# Patient Record
Sex: Male | Born: 1960 | Race: White | Hispanic: No | Smoking: Current every day smoker
Health system: Southern US, Community
[De-identification: ages and names within clinical notes are randomized; demographics above are authoritative.]

## PROBLEM LIST (undated history)

## (undated) DIAGNOSIS — R9439 Abnormal result of other cardiovascular function study: Secondary | ICD-10-CM

## (undated) DIAGNOSIS — IMO0002 Reserved for concepts with insufficient information to code with codable children: Secondary | ICD-10-CM

## (undated) DIAGNOSIS — I10 Essential (primary) hypertension: Secondary | ICD-10-CM

## (undated) DIAGNOSIS — Z720 Tobacco use: Secondary | ICD-10-CM

## (undated) HISTORY — DX: Reserved for concepts with insufficient information to code with codable children: IMO0002

## (undated) HISTORY — DX: Essential (primary) hypertension: I10

## (undated) HISTORY — DX: Abnormal result of other cardiovascular function study: R94.39

## (undated) HISTORY — DX: Tobacco use: Z72.0

## (undated) HISTORY — PX: LUMBAR FUSION: SHX111

---

## 2005-03-06 ENCOUNTER — Encounter (INDEPENDENT_AMBULATORY_CARE_PROVIDER_SITE_OTHER): Payer: Self-pay | Admitting: *Deleted

## 2005-03-06 ENCOUNTER — Ambulatory Visit (HOSPITAL_COMMUNITY): Admission: RE | Admit: 2005-03-06 | Discharge: 2005-03-06 | Payer: Self-pay | Admitting: Gastroenterology

## 2009-02-03 ENCOUNTER — Emergency Department (HOSPITAL_COMMUNITY): Admission: EM | Admit: 2009-02-03 | Discharge: 2009-02-03 | Payer: Self-pay | Admitting: Emergency Medicine

## 2009-02-06 ENCOUNTER — Emergency Department (HOSPITAL_COMMUNITY): Admission: EM | Admit: 2009-02-06 | Discharge: 2009-02-06 | Payer: Self-pay | Admitting: Emergency Medicine

## 2009-02-09 ENCOUNTER — Encounter: Admission: RE | Admit: 2009-02-09 | Discharge: 2009-02-09 | Payer: Self-pay | Admitting: Orthopedic Surgery

## 2009-04-20 ENCOUNTER — Inpatient Hospital Stay (HOSPITAL_COMMUNITY): Admission: RE | Admit: 2009-04-20 | Discharge: 2009-04-24 | Payer: Self-pay | Admitting: Neurosurgery

## 2009-10-23 ENCOUNTER — Ambulatory Visit: Payer: Self-pay | Admitting: Cardiovascular Disease

## 2010-05-16 LAB — CBC
HCT: 46.6 % (ref 39.0–52.0)
Hemoglobin: 16.3 g/dL (ref 13.0–17.0)
MCV: 98.1 fL (ref 78.0–100.0)
RDW: 13.4 % (ref 11.5–15.5)

## 2010-05-16 LAB — BASIC METABOLIC PANEL
BUN: 8 mg/dL (ref 6–23)
Calcium: 8.6 mg/dL (ref 8.4–10.5)
Glucose, Bld: 121 mg/dL — ABNORMAL HIGH (ref 70–99)
Sodium: 133 mEq/L — ABNORMAL LOW (ref 135–145)

## 2010-05-16 LAB — ABO/RH: ABO/RH(D): AB POS

## 2010-05-16 LAB — SURGICAL PCR SCREEN

## 2010-10-15 ENCOUNTER — Encounter: Payer: Self-pay | Admitting: Cardiovascular Disease

## 2010-10-25 ENCOUNTER — Other Ambulatory Visit: Payer: Self-pay | Admitting: *Deleted

## 2010-10-25 DIAGNOSIS — I10 Essential (primary) hypertension: Secondary | ICD-10-CM

## 2010-10-31 ENCOUNTER — Other Ambulatory Visit: Payer: Self-pay | Admitting: *Deleted

## 2010-10-31 ENCOUNTER — Ambulatory Visit: Payer: Self-pay | Admitting: Cardiovascular Disease

## 2010-11-22 ENCOUNTER — Encounter: Payer: Self-pay | Admitting: Cardiovascular Disease

## 2012-06-10 ENCOUNTER — Encounter: Payer: Self-pay | Admitting: *Deleted

## 2016-02-20 ENCOUNTER — Emergency Department (HOSPITAL_COMMUNITY)
Admission: EM | Admit: 2016-02-20 | Discharge: 2016-02-20 | Disposition: A | Discharge status: Home / Self Care | Payer: Self-pay | Attending: Emergency Medicine | Admitting: Emergency Medicine

## 2016-02-20 ENCOUNTER — Emergency Department (HOSPITAL_COMMUNITY): Payer: Self-pay

## 2016-02-20 ENCOUNTER — Encounter (HOSPITAL_COMMUNITY): Payer: Self-pay

## 2016-02-20 DIAGNOSIS — M7918 Myalgia, other site: Secondary | ICD-10-CM

## 2016-02-20 DIAGNOSIS — I10 Essential (primary) hypertension: Secondary | ICD-10-CM | POA: Insufficient documentation

## 2016-02-20 DIAGNOSIS — Z79899 Other long term (current) drug therapy: Secondary | ICD-10-CM | POA: Insufficient documentation

## 2016-02-20 DIAGNOSIS — Z7982 Long term (current) use of aspirin: Secondary | ICD-10-CM | POA: Insufficient documentation

## 2016-02-20 DIAGNOSIS — F17200 Nicotine dependence, unspecified, uncomplicated: Secondary | ICD-10-CM | POA: Insufficient documentation

## 2016-02-20 DIAGNOSIS — M791 Myalgia: Secondary | ICD-10-CM | POA: Insufficient documentation

## 2016-02-20 LAB — I-STAT TROPONIN, ED: Troponin i, poc: 0.02 ng/mL (ref 0.00–0.08)

## 2016-02-20 LAB — COMPREHENSIVE METABOLIC PANEL
ALBUMIN: 4.1 g/dL (ref 3.5–5.0)
ALT: 27 U/L (ref 17–63)
AST: 25 U/L (ref 15–41)
Alkaline Phosphatase: 78 U/L (ref 38–126)
Anion gap: 10 (ref 5–15)
BUN: 11 mg/dL (ref 6–20)
CHLORIDE: 100 mmol/L — AB (ref 101–111)
CO2: 24 mmol/L (ref 22–32)
Calcium: 9.6 mg/dL (ref 8.9–10.3)
Creatinine, Ser: 1.09 mg/dL (ref 0.61–1.24)
GFR calc Af Amer: >60 mL/min (ref >60)
GFR calc non Af Amer: >60 mL/min (ref >60)
GLUCOSE: 101 mg/dL — AB (ref 65–99)
POTASSIUM: 4.8 mmol/L (ref 3.5–5.1)
SODIUM: 134 mmol/L — AB (ref 135–145)
Total Bilirubin: 1.1 mg/dL (ref 0.3–1.2)
Total Protein: 7.2 g/dL (ref 6.5–8.1)

## 2016-02-20 LAB — CBC
HEMATOCRIT: 48.9 % (ref 39.0–52.0)
Hemoglobin: 17.5 g/dL — ABNORMAL HIGH (ref 13.0–17.0)
MCH: 34.2 pg — AB (ref 26.0–34.0)
MCHC: 35.8 g/dL (ref 30.0–36.0)
MCV: 95.5 fL (ref 78.0–100.0)
Platelets: 325 10*3/uL (ref 150–400)
RBC: 5.12 MIL/uL (ref 4.22–5.81)
RDW: 13.3 % (ref 11.5–15.5)
WBC: 8.8 10*3/uL (ref 4.0–10.5)

## 2016-02-20 LAB — URINALYSIS, ROUTINE W REFLEX MICROSCOPIC
Bilirubin Urine: NEGATIVE
Glucose, UA: NEGATIVE mg/dL
HGB URINE DIPSTICK: NEGATIVE
Ketones, ur: NEGATIVE mg/dL
Leukocytes, UA: NEGATIVE
Nitrite: NEGATIVE
PH: 5 (ref 5.0–8.0)
Protein, ur: NEGATIVE mg/dL
SPECIFIC GRAVITY, URINE: 1.021 (ref 1.005–1.030)

## 2016-02-20 LAB — LIPASE, BLOOD: LIPASE: 41 U/L (ref 11–51)

## 2016-02-20 MED ORDER — MELOXICAM 7.5 MG PO TABS: 7.5000 mg | ORAL_TABLET | Freq: Every day | ORAL | 0 refills | Status: DC

## 2016-02-20 NOTE — Discharge Instructions (Signed)
Your work up today did not reveal an emergent cause for your symptoms.  It is likely musculoskeletal in nature.  Please start taking Mobic once daily, you may take it with food if it causes GI upset or diarrhea.  Apply heat for 10-20 minutes three times daily.  It is important you follow up with your primary care physician as soon as possible for further outpatient management.  Return to the ED for any new or worsening symptoms.

## 2016-02-20 NOTE — ED Provider Notes (Signed)
[facility]-EMERGENCY DEPT Provider Note   CSN: [id number] Arrival date & time: 02/20/16  [id number]     History   Chief Complaint Chief Complaint  Patient presents with  . Abdominal Pain    HPI Shirlee LatchWallace J Boxley is a 55 y.o. male.  HPI Shirlee LatchWallace J Lukasik is a 55 y.o. male with PMH significant for HTN, tobacco abuse, and DDD who presents with sudden onset, waxing and waning, "intense" RUQ abdominal pain that initially began over the summer.  He states it went away spontaneously and then returned in November and then became worse 3 days ago.  He states he initially thought it was a "pulled muscle".  No associated fever, chills, cough, CP, SOB, abdominal pain, N/V/D, flank pain, or urinary symptoms.  He has been taking Ibuprofen with some relief, but it never completely goes away.  No prior abdominal surgeries.  No hx of PE/DVT.  No unilateral leg swelling. No trauma/injury.  Past Medical History:  Diagnosis Date  . Abnormal stress test    Low risk but abnormal stress test  . DDD (degenerative disc disease)   . Hypertension   . Tobacco abuse     There are no active problems to display for this patient.   History reviewed. No pertinent surgical history.     Home Medications    Prior to Admission medications   Medication Sig Start Date End Date Taking? Authorizing Provider  aspirin 81 MG chewable tablet Chew 81 mg by mouth every evening.   Yes Historical Provider, MD  losartan (COZAAR) 100 MG tablet Take 100 mg by mouth every evening.  02/17/16  Yes Historical Provider, MD  meloxicam (MOBIC) 7.5 MG tablet Take 1 tablet (7.5 mg total) by mouth daily. 02/20/16   Cheri FowlerKayla Tam Delisle, PA-C    Family History No family history on file.  Social History Social History  Substance Use Topics  . Smoking status: Current Every Day Smoker    Packs/day: 0.50  . Smokeless tobacco: Not on file  . Alcohol use Yes     Allergies   Patient has no known allergies.   Review of Systems Review of  Systems All other systems negative unless otherwise stated in HPI   Physical Exam Updated Vital Signs BP 183/81   Pulse 85   Temp 98.1 F (36.7 C) (Oral)   Resp 18   Ht 5\' 10"  (1.778 m)   Wt 77.1 kg   SpO2 97%   BMI 24.39 kg/m   Physical Exam  Constitutional: He is oriented to person, place, and time. He appears well-developed and well-nourished.  Non-toxic appearance. He does not have a sickly appearance. He does not appear ill.  HENT:  Head: Normocephalic and atraumatic.  Mouth/Throat: Oropharynx is clear and moist.  Eyes: Conjunctivae are normal. Pupils are equal, round, and reactive to light.  Neck: Normal range of motion. Neck supple.  Cardiovascular: Normal rate and regular rhythm.   Pulmonary/Chest: Effort normal and breath sounds normal. No accessory muscle usage or stridor. No respiratory distress. He has no wheezes. He has no rhonchi. He has no rales.     He exhibits no tenderness.    Abdominal: Soft. Bowel sounds are normal. He exhibits no distension. There is no tenderness. There is no rebound and no guarding.  Musculoskeletal: Normal range of motion.  Lymphadenopathy:    He has no cervical adenopathy.  Neurological: He is alert and oriented to person, place, and time.  Speech clear without dysarthria.  Skin: Skin is warm and  dry.  Psychiatric: He has a normal mood and affect. His behavior is normal.     ED Treatments / Results  Labs (all labs ordered are listed, but only abnormal results are displayed) Labs Reviewed  COMPREHENSIVE METABOLIC PANEL - Abnormal; Notable for the following:       Result Value   Sodium 134 (*)    Chloride 100 (*)    Glucose, Bld 101 (*)    All other components within normal limits  CBC - Abnormal; Notable for the following:    Hemoglobin 17.5 (*)    MCH 34.2 (*)    All other components within normal limits  LIPASE, BLOOD  URINALYSIS, ROUTINE W REFLEX MICROSCOPIC  I-STAT TROPOININ, ED    EKG  EKG  Interpretation None       Radiology Dg Chest 2 View  Result Date: 02/20/2016 CLINICAL DATA:  Right chest/axillary pain EXAM: CHEST  2 VIEW COMPARISON:  April 20, 2009 FINDINGS: There is bullous disease in the right apex. Lungs elsewhere are clear. Heart size and pulmonary vascularity are normal. No adenopathy. No bone lesions. There are foci of calcification in each carotid artery. IMPRESSION: No edema or consolidation. Bullous disease right apex. Foci of carotid artery calcification bilaterally. Electronically Signed   By: Bretta BangWilliam  Woodruff III M.D.   On: 02/20/2016 11:07    Procedures Procedures (including critical care time)  Medications Ordered in ED Medications - No data to display   Initial Impression / Assessment and Plan / ED Course  I have reviewed the triage vital signs and the nursing notes.  Pertinent labs & imaging results that were available during my care of the patient were reviewed by me and considered in my medical decision making (see chart for details).  Clinical Course    Patient presents with right sided pain, he points to his right lower chest wall and RUQ.  He has no other symptoms.  This began in the summer and has been intermittent.  Feels like pulled muscle.  No trigger.  Vitals stable.  Physical exam as above.  Labs without acute abnormalities.  CXR does show some bullous disease, advised the patient to stop smoking.  Clinically this appears to be musculoskeletal in nature and recommend trying Mobic and heat.  Low suspicion for ACS, PE, gall bladder disease, or other acute cardiopulmonary or GI etiology.  This does not sound like nephrolithiasis.  Patient has PCP and urged follow up for continued outpatient management.  Return precautions discussed.  Stable for discharge.  Case has been discussed with Dr. Effie ShyWentz who agrees with the above plan for discharge.   Final Clinical Impressions(s) / ED Diagnoses   Final diagnoses:  Musculoskeletal pain    New  Prescriptions Discharge Medication List as of 02/20/2016 12:18 PM    START taking these medications   Details  meloxicam (MOBIC) 7.5 MG tablet Take 1 tablet (7.5 mg total) by mouth daily., Starting Wed 02/20/2016, Print         Cheri FowlerKayla Tejon Gracie, PA-C 02/20/16 2013    Mancel BaleElliott Wentz, MD 02/21/16 [phone]

## 2016-02-20 NOTE — ED Notes (Signed)
PT is in stable condition upon d/c and ambulates from ED. 

## 2016-02-20 NOTE — ED Notes (Signed)
Rt upper flank pain x 2-3 weeks,  Got worse on Sunday area was tender to touch, hurts to take a deep breath, no worsening cough denies n/v states stools have been loose but not diarrhea, denies dysuria

## 2016-02-20 NOTE — ED Triage Notes (Signed)
Pt presents for evaluation of R upper side pain starting 1 week ago. Pt. Reports pain occurred once before but resolved on own. Pt. Reports pain worse with deep breathing and movement. Pt. States initially was tender but now is nontender to palpation. Pt AxO x4, ambulatory in triage. Denies N/V/D. Denies CP/SOB

## 2018-07-10 IMAGING — DX DG CHEST 2V
2 series · 2 of 2 positions shown · non-contrast
Comparison: April 20, 2009

CLINICAL DATA: Right chest/axillary pain

EXAM:
CHEST  2 VIEW

[w chest pa]
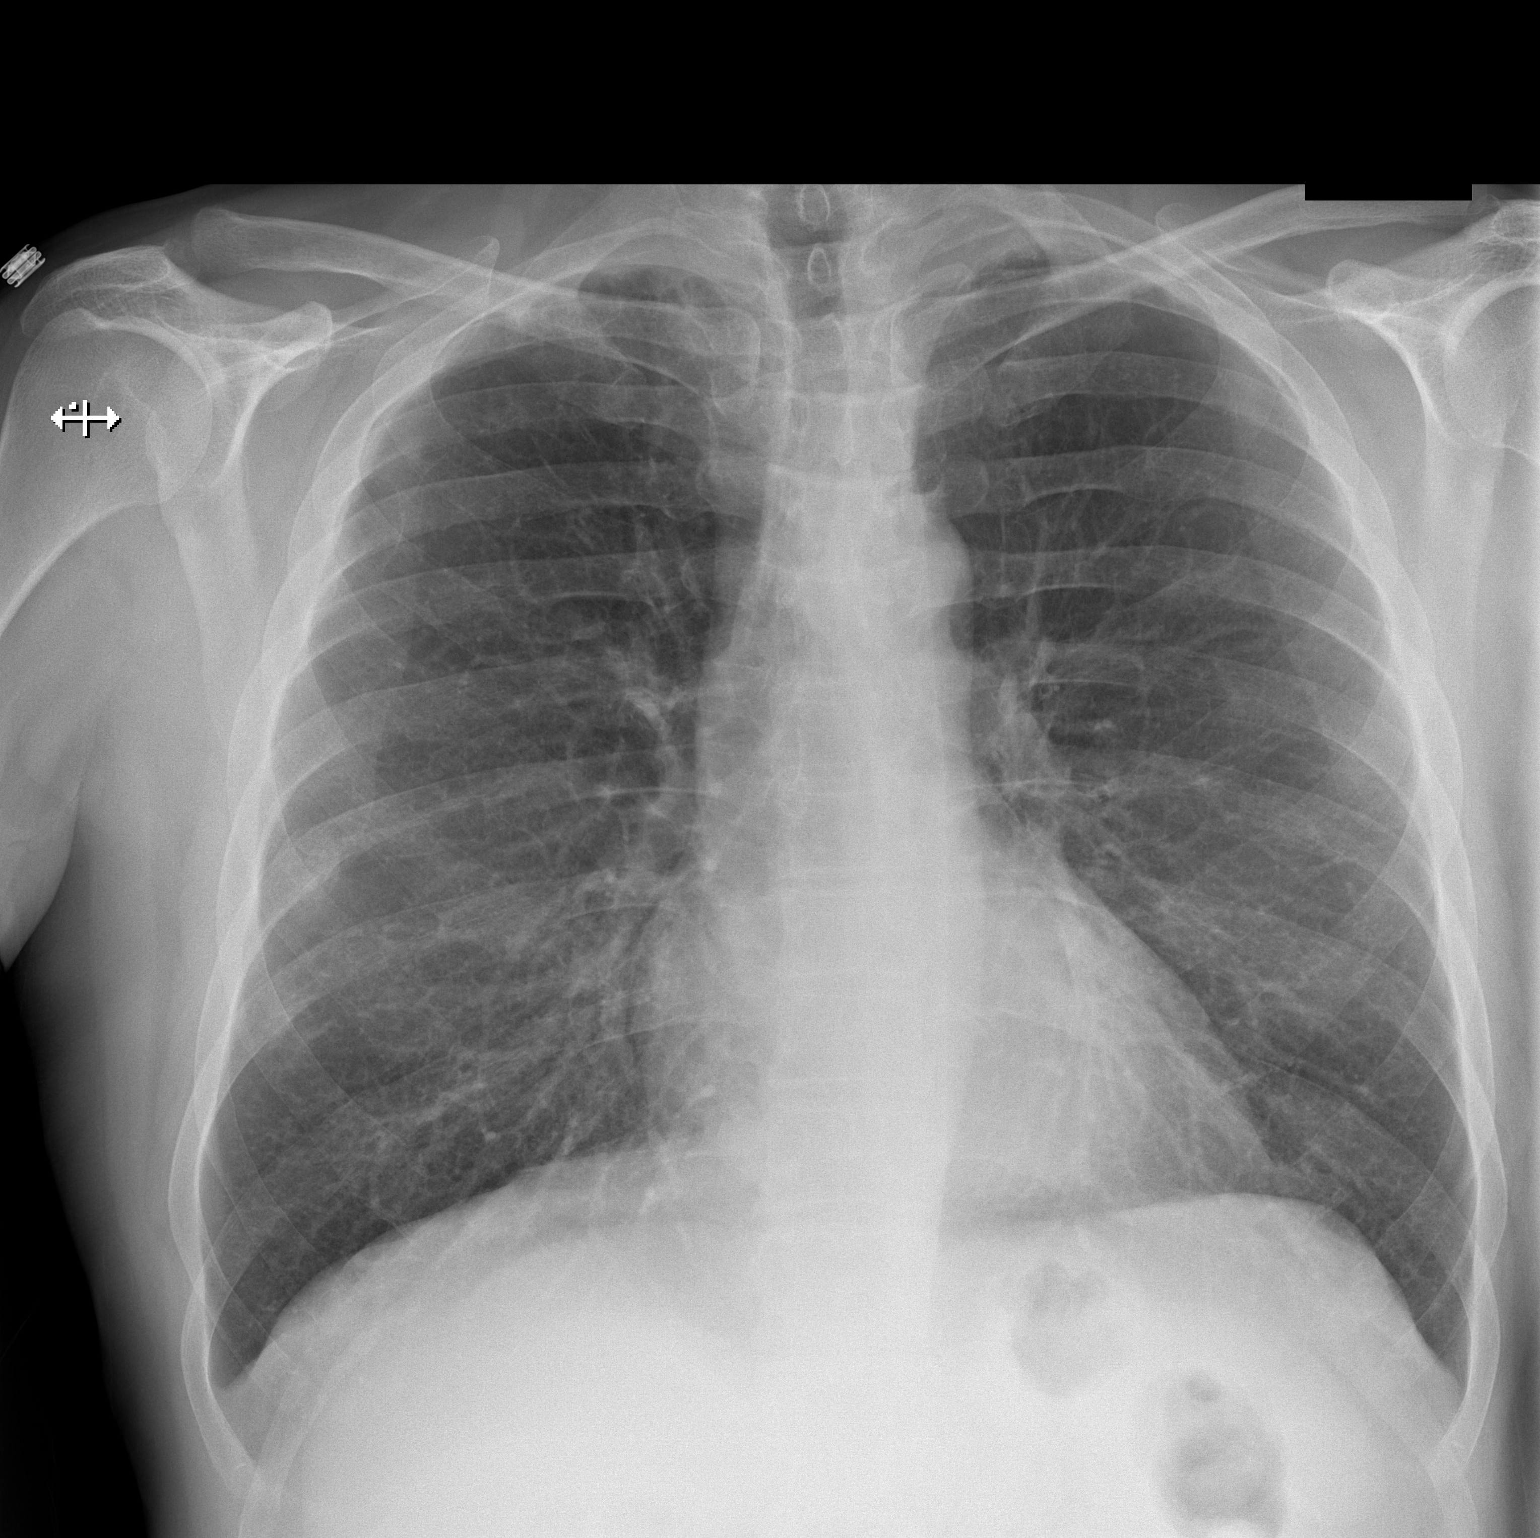

[w chest lat]
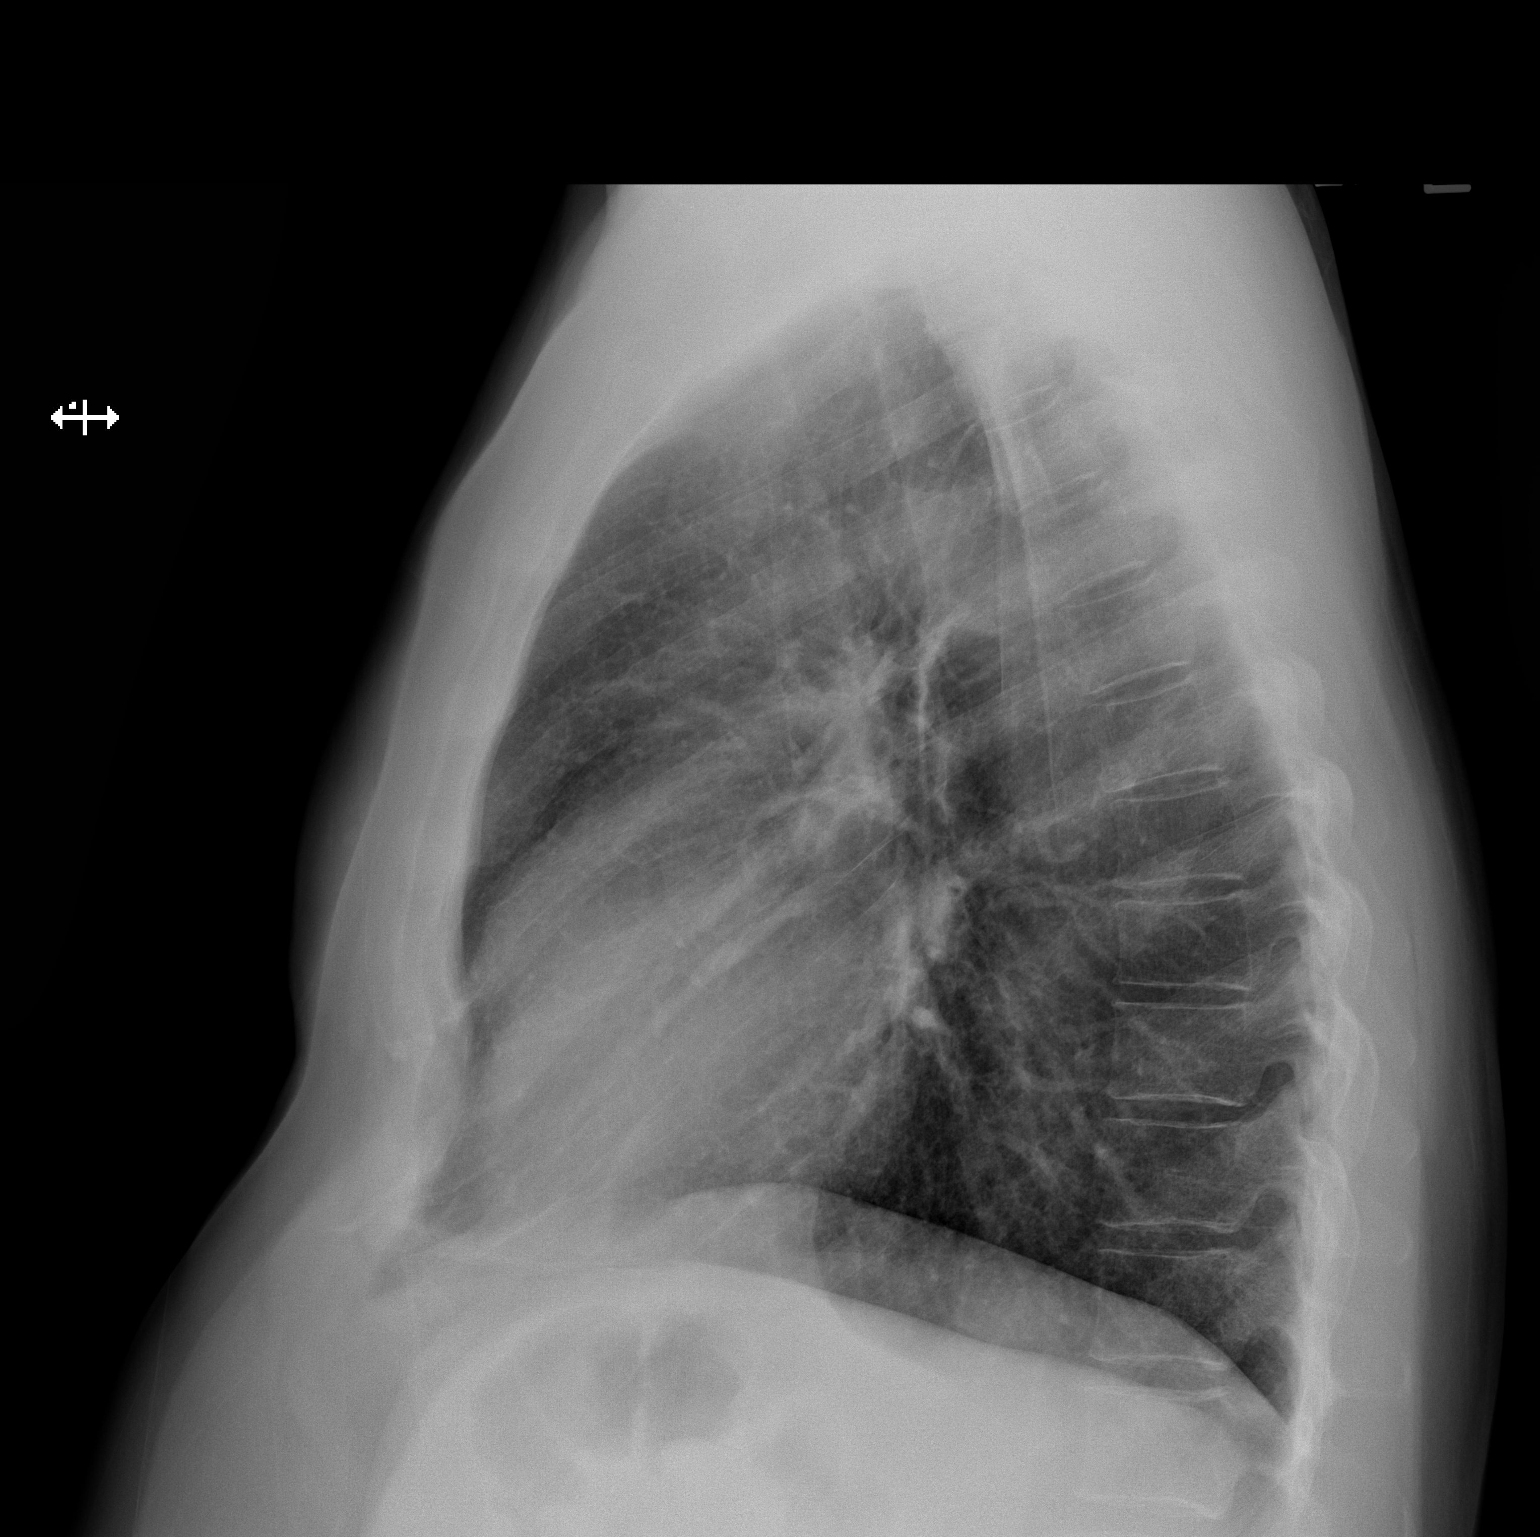

[2 of 2 positions shown; findings below may reference images not displayed]

FINDINGS: There is bullous disease in the right apex. Lungs elsewhere are
clear. Heart size and pulmonary vascularity are normal. No
adenopathy. No bone lesions. There are foci of calcification in each
carotid artery.
IMPRESSION: No edema or consolidation. Bullous disease right apex. Foci of
carotid artery calcification bilaterally.

## 2022-01-07 ENCOUNTER — Emergency Department (HOSPITAL_BASED_OUTPATIENT_CLINIC_OR_DEPARTMENT_OTHER)
Admission: EM | Admit: 2022-01-07 | Discharge: 2022-01-07 | Disposition: A | Discharge status: Home / Self Care | Payer: No Typology Code available for payment source | Attending: Emergency Medicine | Admitting: Emergency Medicine

## 2022-01-07 ENCOUNTER — Other Ambulatory Visit: Payer: Self-pay

## 2022-01-07 ENCOUNTER — Emergency Department (HOSPITAL_BASED_OUTPATIENT_CLINIC_OR_DEPARTMENT_OTHER): Payer: No Typology Code available for payment source | Admitting: Radiology

## 2022-01-07 ENCOUNTER — Encounter (HOSPITAL_BASED_OUTPATIENT_CLINIC_OR_DEPARTMENT_OTHER): Payer: Self-pay | Admitting: Emergency Medicine

## 2022-01-07 DIAGNOSIS — Z23 Encounter for immunization: Secondary | ICD-10-CM | POA: Diagnosis not present

## 2022-01-07 DIAGNOSIS — I10 Essential (primary) hypertension: Secondary | ICD-10-CM | POA: Diagnosis not present

## 2022-01-07 DIAGNOSIS — Z87891 Personal history of nicotine dependence: Secondary | ICD-10-CM | POA: Diagnosis not present

## 2022-01-07 DIAGNOSIS — M25511 Pain in right shoulder: Secondary | ICD-10-CM | POA: Insufficient documentation

## 2022-01-07 DIAGNOSIS — M542 Cervicalgia: Secondary | ICD-10-CM | POA: Insufficient documentation

## 2022-01-07 DIAGNOSIS — Z7982 Long term (current) use of aspirin: Secondary | ICD-10-CM | POA: Insufficient documentation

## 2022-01-07 DIAGNOSIS — J449 Chronic obstructive pulmonary disease, unspecified: Secondary | ICD-10-CM | POA: Diagnosis not present

## 2022-01-07 DIAGNOSIS — S51812A Laceration without foreign body of left forearm, initial encounter: Secondary | ICD-10-CM | POA: Insufficient documentation

## 2022-01-07 DIAGNOSIS — Y92410 Unspecified street and highway as the place of occurrence of the external cause: Secondary | ICD-10-CM | POA: Diagnosis not present

## 2022-01-07 DIAGNOSIS — S59912A Unspecified injury of left forearm, initial encounter: Secondary | ICD-10-CM | POA: Diagnosis present

## 2022-01-07 MED ORDER — AMLODIPINE BESYLATE 5 MG PO TABS
5.0000 mg | ORAL_TABLET | Freq: Once | ORAL | Status: AC
  Administered 2022-01-07: 5 mg via ORAL
  Filled 2022-01-07: qty 1

## 2022-01-07 MED ORDER — TETANUS-DIPHTH-ACELL PERTUSSIS 5-2.5-18.5 LF-MCG/0.5 IM SUSY
0.5000 mL | PREFILLED_SYRINGE | Freq: Once | INTRAMUSCULAR | Status: AC
  Administered 2022-01-07: 0.5 mL via INTRAMUSCULAR
  Filled 2022-01-07: qty 0.5

## 2022-01-07 MED ORDER — OXYCODONE HCL 5 MG PO TABS
5.0000 mg | ORAL_TABLET | Freq: Once | ORAL | Status: AC
  Administered 2022-01-07: 5 mg via ORAL
  Filled 2022-01-07: qty 1

## 2022-01-07 MED ORDER — LOSARTAN POTASSIUM 25 MG PO TABS
100.0000 mg | ORAL_TABLET | Freq: Every day | ORAL | Status: DC
  Administered 2022-01-07: 100 mg via ORAL
  Filled 2022-01-07: qty 4

## 2022-01-07 MED ORDER — LOSARTAN POTASSIUM 100 MG PO TABS: 100.0000 mg | ORAL_TABLET | Freq: Every day | ORAL | 2 refills | Status: AC

## 2022-01-07 MED ORDER — ACETAMINOPHEN 500 MG PO TABS
1000.0000 mg | ORAL_TABLET | Freq: Once | ORAL | Status: AC
  Administered 2022-01-07: 1000 mg via ORAL
  Filled 2022-01-07: qty 2

## 2022-01-07 MED ORDER — AMLODIPINE BESYLATE 5 MG PO TABS: 5.0000 mg | ORAL_TABLET | Freq: Every day | ORAL | 2 refills | Status: AC

## 2022-01-07 MED ORDER — KETOROLAC TROMETHAMINE 15 MG/ML IJ SOLN
15.0000 mg | Freq: Once | INTRAMUSCULAR | Status: AC
  Administered 2022-01-07: 15 mg via INTRAMUSCULAR
  Filled 2022-01-07: qty 1

## 2022-01-07 NOTE — ED Notes (Signed)
Md aware of BP 
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
## 2022-01-07 NOTE — Discharge Instructions (Addendum)
You will hurt worse tomorrow.  This is normal.  Please return for confusion vomiting worsening difficulty breathing or abdominal pain.  Please follow-up with your doctor in the office.  As we discussed your blood pressure is significantly elevated here today.  Please take your home medications that I have represcribed for you.  Please follow-up with a family doctor so they can have these rechecked.
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
## 2024-01-18 ENCOUNTER — Emergency Department (HOSPITAL_COMMUNITY): Payer: MEDICAID

## 2024-01-18 ENCOUNTER — Inpatient Hospital Stay (HOSPITAL_COMMUNITY): Admission: EM | Admit: 2024-01-18 | Discharge: 2024-02-25 | DRG: 064 | Disposition: E | Payer: MEDICAID

## 2024-01-18 ENCOUNTER — Encounter (HOSPITAL_COMMUNITY): Payer: Self-pay | Admitting: Radiology

## 2024-01-18 DIAGNOSIS — I10 Essential (primary) hypertension: Secondary | ICD-10-CM

## 2024-01-18 DIAGNOSIS — R29713 NIHSS score 13: Secondary | ICD-10-CM

## 2024-01-18 DIAGNOSIS — I639 Cerebral infarction, unspecified: Principal | ICD-10-CM

## 2024-01-18 DIAGNOSIS — G935 Compression of brain: Secondary | ICD-10-CM

## 2024-01-18 DIAGNOSIS — Z9911 Dependence on respirator [ventilator] status: Secondary | ICD-10-CM

## 2024-01-18 DIAGNOSIS — I63512 Cerebral infarction due to unspecified occlusion or stenosis of left middle cerebral artery: Secondary | ICD-10-CM | POA: Diagnosis present

## 2024-01-18 DIAGNOSIS — J9601 Acute respiratory failure with hypoxia: Secondary | ICD-10-CM

## 2024-01-18 DIAGNOSIS — E440 Moderate protein-calorie malnutrition: Secondary | ICD-10-CM | POA: Insufficient documentation

## 2024-01-18 LAB — I-STAT CHEM 8, ED
BUN: 19 mg/dL (ref 8–23)
Calcium, Ion: 1.06 mmol/L — ABNORMAL LOW (ref 1.15–1.40)
Chloride: 102 mmol/L (ref 98–111)
Creatinine, Ser: 1.4 mg/dL — ABNORMAL HIGH (ref 0.61–1.24)
Glucose, Bld: 98 mg/dL (ref 70–99)
HCT: 46 % (ref 39.0–52.0)
Hemoglobin: 15.6 g/dL (ref 13.0–17.0)
Potassium: 4.2 mmol/L (ref 3.5–5.1)
Sodium: 136 mmol/L (ref 135–145)
TCO2: 22 mmol/L (ref 22–32)

## 2024-01-18 LAB — BASIC METABOLIC PANEL WITH GFR
Anion gap: 14 (ref 5–15)
BUN: 17 mg/dL (ref 8–23)
CO2: 21 mmol/L — ABNORMAL LOW (ref 22–32)
Calcium: 8.6 mg/dL — ABNORMAL LOW (ref 8.9–10.3)
Chloride: 100 mmol/L (ref 98–111)
Creatinine, Ser: 1.48 mg/dL — ABNORMAL HIGH (ref 0.61–1.24)
GFR, Estimated: 53 mL/min — ABNORMAL LOW (ref >60)
Glucose, Bld: 94 mg/dL (ref 70–99)
Potassium: 4.1 mmol/L (ref 3.5–5.1)
Sodium: 135 mmol/L (ref 135–145)

## 2024-01-18 LAB — CBC
HCT: 47.7 % (ref 39.0–52.0)
Hemoglobin: 16.2 g/dL (ref 13.0–17.0)
MCH: 32.7 pg (ref 26.0–34.0)
MCHC: 34 g/dL (ref 30.0–36.0)
MCV: 96.4 fL (ref 80.0–100.0)
Platelets: 240 K/uL (ref 150–400)
RBC: 4.95 MIL/uL (ref 4.22–5.81)
RDW: 13.4 % (ref 11.5–15.5)
WBC: 8.9 K/uL (ref 4.0–10.5)
nRBC: 0 % (ref 0.0–0.2)

## 2024-01-18 LAB — CBG MONITORING, ED: Glucose-Capillary: 105 mg/dL — ABNORMAL HIGH (ref 70–99)

## 2024-01-18 LAB — TROPONIN I (HIGH SENSITIVITY)
Troponin I (High Sensitivity): 121 ng/L (ref <18)
Troponin I (High Sensitivity): 121 ng/L (ref <18)

## 2024-01-18 LAB — CK: Total CK: 309 U/L (ref 49–397)

## 2024-01-18 MED ORDER — ACETAMINOPHEN 325 MG PO TABS: 650.0000 mg | ORAL_TABLET | ORAL | Status: DC | PRN

## 2024-01-18 MED ORDER — IOHEXOL 350 MG/ML SOLN
75.0000 mL | Freq: Once | INTRAVENOUS | Status: AC | PRN
  Administered 2024-01-18: 75 mL via INTRAVENOUS

## 2024-01-18 MED ORDER — ACETAMINOPHEN 650 MG RE SUPP: 650.0000 mg | RECTAL | Status: DC | PRN

## 2024-01-18 MED ORDER — HYDRALAZINE HCL 20 MG/ML IJ SOLN: 10.0000 mg | Freq: Four times a day (QID) | INTRAMUSCULAR | Status: DC | PRN

## 2024-01-18 MED ORDER — ENOXAPARIN SODIUM 40 MG/0.4ML IJ SOSY
40.0000 mg | PREFILLED_SYRINGE | INTRAMUSCULAR | Status: DC
  Administered 2024-01-19 – 2024-01-24 (×6): 40 mg via SUBCUTANEOUS
  Filled 2024-01-18 (×6): qty 0.4

## 2024-01-18 MED ORDER — ASPIRIN 300 MG RE SUPP
300.0000 mg | Freq: Once | RECTAL | Status: AC
  Administered 2024-01-19: 300 mg via RECTAL
  Filled 2024-01-18: qty 1

## 2024-01-18 MED ORDER — ASPIRIN 81 MG PO CHEW: 324.0000 mg | CHEWABLE_TABLET | Freq: Once | ORAL | Status: DC

## 2024-01-18 MED ORDER — ACETAMINOPHEN 160 MG/5ML PO SOLN
650.0000 mg | ORAL | Status: DC | PRN
  Administered 2024-01-24: 650 mg
  Filled 2024-01-18: qty 20.3

## 2024-01-18 MED ORDER — SODIUM CHLORIDE 0.9 % IV SOLN: INTRAVENOUS | Status: AC

## 2024-01-18 MED ORDER — SENNOSIDES-DOCUSATE SODIUM 8.6-50 MG PO TABS
1.0000 | ORAL_TABLET | Freq: Every evening | ORAL | Status: DC | PRN
  Administered 2024-01-22: 1 via ORAL
  Filled 2024-01-18: qty 1

## 2024-01-18 MED ORDER — STROKE: EARLY STAGES OF RECOVERY BOOK
Freq: Once | Status: AC
  Filled 2024-01-18: qty 1

## 2024-01-18 NOTE — Consult Note (Signed)
 NEUROLOGY CONSULT NOTE   Date of service: January 18, 2024 Patient Name: Edward Phelps MRN:  [id number] DOB:  03-Nov-1960 Chief Complaint: Right sided weakness, speech difficulty Requesting Provider: Ula Prentice SAUNDERS, MD  History of Present Illness  Edward Phelps is a 63 y.o. male with hx of hypertension, tobacco abuse, chronic low back pain due to old back injury at work, presenting for evaluation of right-sided weakness and inability to talk as well as right-sided facial drooping which was noticed by family today.  Last known well when he came back from work Friday and has since not been spoken to.  Was checked on bio friend today who noticed him sitting on the floor, with right facial droop and unable to talk normally.  Brought into the ED.  Not a candidate for code stroke activation due to being outside the window.  Imaging reveals left MCA territory infarct on the MRI.  CT angiography head and neck reveals left ICA occlusion at the origin and left M1 occlusion with poor left hemispheric collaterals  LKW: Sometime the evening of 01/15/2023 Modified rankin score: 1-No significant post stroke disability and can perform usual duties with stroke symptoms IV Thrombolysis: Outside the window EVT: Outside the window  NIHSS components Score: Comment  1a Level of Conscious 0[x]  1[]  2[]  3[]      1b LOC Questions 0[]  1[]  2[x]       1c LOC Commands 0[]  1[]  2[x]       2 Best Gaze 0[x]  1[]  2[]       3 Visual 0[x]  1[]  2[]  3[]      4 Facial Palsy 0[]  1[]  2[x]  3[]      5a Motor Arm - left 0[x]  1[]  2[]  3[]  4[]  UN[]    5b Motor Arm - Right 0[]  1[]  2[x]  3[]  4[]  UN[]    6a Motor Leg - Left 0[x]  1[]  2[]  3[]  4[]  UN[]    6b Motor Leg - Right 0[]  1[x]  2[]  3[]  4[]  UN[]    7 Limb Ataxia 0[x]  1[]  2[]  UN[]      8 Sensory 0[x]  1[]  2[]  UN[]      9 Best Language 0[]  1[]  2[x]  3[]      10 Dysarthria 0[]  1[]  2[x]  UN[]      11 Extinct. and Inattention 0[x]  1[]  2[]       TOTAL: 13      ROS  Comprehensive ROS performed and  pertinent positives documented in HPI   Past History   Past Medical History:  Diagnosis Date   Abnormal stress test    Low risk but abnormal stress test   DDD (degenerative disc disease)    Hypertension    Tobacco abuse     Past Surgical History:  Procedure Laterality Date   LUMBAR FUSION      Family History: No family history on file.  Social History  reports that he has been smoking. He does not have any smokeless tobacco history on file. He reports current alcohol use. No history on file for drug use.  No Known Allergies  Medications  No current facility-administered medications for this encounter.  Current Outpatient Medications:    amLODipine  (NORVASC ) 5 MG tablet, Take 1 tablet (5 mg total) by mouth daily., Disp: 30 tablet, Rfl: 2   aspirin  81 MG chewable tablet, Chew 81 mg by mouth every evening., Disp: , Rfl:    losartan  (COZAAR ) 100 MG tablet, Take 1 tablet (100 mg total) by mouth daily., Disp: 30 tablet, Rfl: 2   meloxicam  (MOBIC ) 7.5 MG tablet, Take 1 tablet (7.5 mg  total) by mouth daily., Disp: 14 tablet, Rfl: 0  Vitals   Vitals:   2024-02-02 1913 Feb 02, 2024 1945 02-Feb-2024 2113  BP: (!) 207/124 (!) 190/111 (!) 190/94  Pulse: 100 89 97  Resp: 15 16 16   Temp: 98 F (36.7 C)  97.9 F (36.6 C)  TempSrc: Oral  Oral  SpO2: 98% 97% 97%    There is no height or weight on file to calculate BMI.   Physical Exam   General: Awake alert in no distress HEENT: Normocephalic atraumatic Lungs: Clear Cardiovascular: Regular rate rhythm Abdomen nondistended nontender Neurological exam Awake alert Is able to follow some simple commands by mimicking but that is not consistent Paucity of speech with some words randomly coming out when he tries to talk but he is extremely aphasic-more expressive than receptive Cranial nerves: Pupils equal round react light, extraocular movements appear unhindered, visual fields full to threat, right lower facial weakness noted at rest  and upon smiling. 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  Left side is full strength. Sensation appears intact to light touch Difficult to assess for dysmetria  Labs/Imaging/Neurodiagnostic studies   CBC:  Recent Labs  Lab 2024/02/02 1922 02-02-24 1948  WBC 8.9  --   HGB 16.2 15.6  HCT 47.7 46.0  MCV 96.4  --   PLT 240  --    Basic Metabolic Panel:  Lab Results  Component Value Date   NA 136 2024-02-02   K 4.2 2024-02-02   CO2 21 (L) Feb 02, 2024   GLUCOSE 98 2024-02-02   BUN 19 02-02-24   CREATININE 1.40 (H) 02/02/24   CALCIUM  8.6 (L) 02-02-2024   GFRNONAA 53 (L) 02-02-2024   GFRAA >60 02/20/2016   CT angio Head and Neck with contrast(Personally reviewed): Left ICA occluded at the origin Left M1 occlusion proximally with abrupt left mid M1 occlusion.  Poor distal left MCA opacification.  Poor collaterals on the left hemisphere   MRI Brain(Personally reviewed): Acute left MCA territory infarct with no midline shift and abnormal left ICA flow void   ASSESSMENT   Edward Phelps is a 63 y.o. male past history of hypertension, tobacco abuse, CAD last seen well or heard well from on Friday, 01/15/2024, noted to be weak on the right side, having right facial droop and difficulty with his speech when checked on upon by a friend today.  MRI of the brain shows a left hemispheric acute ischemic infarct and an absent left ICA flow void.  CT angiography confirms left ICA and left M1 occlusion. Outside the window for thrombolysis or thrombectomy with last known well being 2 to 3 days ago Will need admission for stroke risk factor workup and therapy assessments  Impression: Acute ischemic stroke  RECOMMENDATIONS  Admit to hospitalist Frequent neurochecks Telemetry He is on aspirin  81 at home-continue for now High intensity statin for goal LDL less than  70 2D echo J8r Lipid panel Therapy assessments-PT, OT, speech therapy N.p.o. until cleared by bedside swallow evaluation or formal swallow evaluation No need for permissive hypertension-she is already about 3 days from his last known well.  Avoid hypotension. Stroke team to follow Plan discussed with ED provider Dr. Prentice Medicus and Dr. Lou, admitting hospitalist  ______________________________________________________________________    Signed, Eligio Lav, MD [facility]

## 2024-01-18 NOTE — ED Provider Notes (Signed)
 [facility] EMERGENCY DEPARTMENT AT [facility] Provider Note   CSN: [id number] Arrival date & time: 01/18/24  8094     Patient presents with: Altered Mental Status   EFSTATHIOS SAWIN is a 63 y.o. male.   63 year old male with past medical history of hypertension, tobacco abuse, and degenerative disc disease presenting to the emergency department today with altered mental status.  The patient was apparently found down by his neighbor today.  Last known normal was Saturday.  The patient was found sitting in his living room.  He is able to follow commands but seems to have some right-sided weakness.  He was brought to the ER for further evaluation.   Altered Mental Status      Prior to Admission medications   Medication Sig Start Date End Date Taking? Authorizing Provider  amLODipine  (NORVASC ) 5 MG tablet Take 1 tablet (5 mg total) by mouth daily. 01/07/22 04/07/22  Emil Share, DO  aspirin  81 MG chewable tablet Chew 81 mg by mouth every evening.    [provider]  losartan  (COZAAR ) 100 MG tablet Take 1 tablet (100 mg total) by mouth daily. 01/07/22 04/07/22  Emil Share, DO  meloxicam  (MOBIC ) 7.5 MG tablet Take 1 tablet (7.5 mg total) by mouth daily. 02/20/16   Rose, Kayla, PA-C    Allergies: Patient has no known allergies.    Review of Systems  Reason unable to perform ROS: AMS/expressive aphasia.    Updated Vital Signs BP (!) 190/94 (BP Location: Right Arm)   Pulse 97   Temp 97.9 F (36.6 C) (Oral)   Resp 16   SpO2 97%   Physical Exam Vitals and nursing note reviewed.   Gen: NAD Eyes: PERRL, EOMI HEENT: no oropharyngeal swelling Neck: trachea midline Resp: clear to auscultation bilaterally Card: RRR, no murmurs, rubs, or gallops Abd: nontender, nondistended Extremities: no calf tenderness, no edema Vascular: 2+ radial pulses bilaterally, 2+ DP pulses bilaterally Neuro: Patient does appear to have a right-sided facial droop, right upper  extremity weakness, and expressive aphasia, does have some mild drift in the right lower extremity but strength seems relatively equal, is able to follow commands Skin: no rashes Psyc: acting appropriately   (all labs ordered are listed, but only abnormal results are displayed) Labs Reviewed  BASIC METABOLIC PANEL WITH GFR - Abnormal; Notable for the following components:      Result Value   CO2 21 (*)    Creatinine, Ser 1.48 (*)    Calcium  8.6 (*)    GFR, Estimated 53 (*)    All other components within normal limits  I-STAT CHEM 8, ED - Abnormal; Notable for the following components:   Creatinine, Ser 1.40 (*)    Calcium , Ion 1.06 (*)    All other components within normal limits  CBG MONITORING, ED - Abnormal; Notable for the following components:   Glucose-Capillary 105 (*)    All other components within normal limits  TROPONIN I (HIGH SENSITIVITY) - Abnormal; Notable for the following components:   Troponin I (High Sensitivity) 121 (*)    All other components within normal limits  CBC  CK  TROPONIN I (HIGH SENSITIVITY)    EKG: EKG Interpretation Date/Time:  Monday January 18 2024 19:12:09 EST Ventricular Rate:  100 PR Interval:  154 QRS Duration:  105 QT Interval:  352 QTC Calculation: 454 R Axis:   60  Text Interpretation: Sinus tachycardia Probable left atrial enlargement LVH with secondary repolarization abnormality Confirmed by Ula Barter (  [id number]) on 01/18/2024 7:25:31 PM  Radiology: CT ANGIO HEAD NECK W WO CM Result Date: 01/18/2024 EXAM: CTA HEAD AND NECK WITH AND WITHOUT 01/18/2024 09:01:07 PM TECHNIQUE: CTA of the head and neck was performed with and without the administration of intravenous contrast. Multiplanar 2D and/or 3D reformatted images are provided for review. Automated exposure control, iterative reconstruction, and/or weight based adjustment of the mA/kV was utilized to reduce the radiation dose to as low as reasonably achievable. COMPARISON: None  available CLINICAL HISTORY: Neuro deficit, acute, stroke suspected FINDINGS: AORTIC ARCH AND ARCH VESSELS: No dissection or arterial injury. No significant stenosis of the brachiocephalic or subclavian arteries. 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 Right carotid is patent without significant stenosis. CERVICAL VERTEBRAL ARTERIES: Moderate left vertebral artery origin stenosis. Otherwise, no significant stenosis. LUNGS AND MEDIASTINUM: Unremarkable. SOFT TISSUES: No acute abnormality. BONES: No acute abnormality. CTA HEAD: ANTERIOR CIRCULATION: Nonopacification of the majority of the left ICA. Reconstitution of the left paraclinoid ICA and proximal left M1 MCA with abrupt occlusion of the left mid M1 MCA. Poor distal MCA opacification/collaterals. Right MCA is patent. No significant stenosis of the anterior cerebral arteries. No significant stenosis of the middle cerebral arteries. No aneurysm. POSTERIOR CIRCULATION: No significant stenosis of the posterior cerebral arteries. No significant stenosis of the basilar artery. No significant stenosis of the vertebral arteries. No aneurysm. OTHER: No dural venous sinus thrombosis on this non-dedicated study. Findings discussed Dr. Arora via telephone at 9:20 PM. IMPRESSION: 1. 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 2. Reconstitution of the left paraclinoid ICA and proximal left M1 MCA with abrupt occlusion of the left mid M1 MCA. Poor distal left MCA opacification/collaterals. Electronically signed by: Gilmore Molt MD 01/18/2024 09:32 PM EST RP Workstation: [id number]   MR BRAIN WO CONTRAST Result Date: 01/18/2024 EXAM: MRI Brain Without Contrast 01/18/2024 08:51:00 PM TECHNIQUE: Multiplanar multisequence MRI of the head/brain was performed without the administration of intravenous contrast. COMPARISON: None available. CLINICAL HISTORY: Mental status change, unknown cause; Neuro deficit, acute, stroke suspected FINDINGS:  Motion limited study. BRAIN AND VENTRICLES: Acute left MCA territory infarcts including infarcts in the left basal ganglia, left insula, overlying left frontal and parietal lobes and to a lesser extent, the anterior left temporal lobe. Edema without midline shift. No intracranial hemorrhage. No mass. No hydrocephalus. Possible abnormal left ICA flow void. Moderate T2 hyperintensities in the white matter, compatible with chronic microvascular ischemic change. ORBITS: No acute abnormality. SINUSES AND MASTOIDS: No acute abnormality. BONES AND SOFT TISSUES: Normal marrow signal. IMPRESSION: 1. Acute left MCA territory infarcts.  No midline shift. 2. Possible abnormal left ICA flow void. Please see forthcoming ordered CTA head/neck. Electronically signed by: Gilmore Molt MD 01/18/2024 09:11 PM EST RP Workstation: [id number]   DG Chest Port 1 View Result Date: 01/18/2024 CLINICAL DATA:  AMS EXAM: PORTABLE CHEST - 1 VIEW COMPARISON:  01/07/2022 FINDINGS: Biapical pleural thickening. No focal airspace consolidation, pleural effusion, or pneumothorax. No cardiomegaly. Tortuous aorta with aortic atherosclerosis. No acute fracture or destructive lesions. Multilevel thoracic osteophytosis. Vascular calcification along the left neck. IMPRESSION: No acute cardiopulmonary abnormality. Electronically Signed   By: Rogelia Myers M.D.   On: 01/18/2024 19:41     Procedures   Medications Ordered in the ED  aspirin  chewable tablet 324 mg (has no administration in time range)  iohexol  (OMNIPAQUE ) 350 MG/ML injection 75 mL (75 mLs Intravenous Contrast Given 01/18/24 2102)  Medical Decision Making 63 year old male with past medical history of hypertension and tobacco abuse presenting to the emergency department today with altered mental status and does appear to have some right sided deficits with expressive aphasia.  The patient's last known normal was on Saturday.  Will further  eval the patient here with a CT angiogram with perfusion study here.  His initial blood pressure was 207/124.  On repeat this was less than 220/120 so we will hold off on treatment of this to allow for permissive hypertension in the event this is an acute stroke.  Also obtain a CK as the patient was found down and was down for an unknown period of time.  CT will evaluate for ischemic versus hemorrhagic stroke.  Given the unilateral deficits suspicion for this is high versus infectious etiology at this time.  The patient's CT scan does show large MCA infarct.  With unclear last known normal and with last known normal being over 24 hours he is not a candidate for neurointervention.  Discussed with Dr. Deedra.  Recommends admission to hospitalist and he will consult.  The patient is admitted for further evaluation management.  CRITICAL CARE Performed by: Prentice JONELLE Medicus   Total critical care time: 30 minutes  Critical care time was exclusive of separately billable procedures and treating other patients.  Critical care was necessary to treat or prevent imminent or life-threatening deterioration.  Critical care was time spent personally by me on the following activities: development of treatment plan with patient and/or surrogate as well as nursing, discussions with consultants, evaluation of patient's response to treatment, examination of patient, obtaining history from patient or surrogate, ordering and performing treatments and interventions, ordering and review of laboratory studies, ordering and review of radiographic studies, pulse oximetry and re-evaluation of patient's condition.   Amount and/or Complexity of Data Reviewed Labs: ordered. Radiology: ordered.  Risk OTC drugs. Decision regarding hospitalization.        Final diagnoses:  Acute CVA (cerebrovascular accident) [facility])    ED Discharge Orders     None          Medicus Prentice JONELLE, MD 01/18/24 2143

## 2024-01-18 NOTE — ED Triage Notes (Addendum)
 Pt BIB [facility] coming from home called out by friend who was checking in on pt. Patient was found on floor. LKW was Saturday 11/22--unknown time.  Patient able to follow simple commands. Expressive aphasia. Mild R sided droop.  EMS 213/117 110HR 96%RA 102 CBG 18g L AC LR

## 2024-01-18 NOTE — H&P (Signed)
 History and Physical  Edward Phelps FMW:[id number] DOB: 1960/05/12 DOA: 01/18/2024  PCP: Medicine, [facility]   Chief Complaint: Right-sided weakness, right facial droop and aphasia  HPI: Edward Phelps is a 63 y.o. male with medical history significant for hypertension, tobacco use disorder, chronic low back pain, DDD, and medication noncompliance who presented via EMS for evaluation of right-sided weakness, right-sided facial droop and aphasia.  Patient with expressive aphasia so history obtained from sons at bedside. They report that patient was last well-known on Friday. He did not go to work today so his boss checked on him and found him sitting on the floor with right facial droop and inability to talk so he called EMS.  Patient was noted to have right sided facial droop, right-sided weakness, expressive aphasia but able to follow simple commands by EMS.  At the bedside, patient attempted to speak but unable to express himself fully. Reports he is still smoking cigarette but unable to quantify.  ED Course: Initial vitals show patient afebrile, hypertensive with SBP in the 160s-200s. Initial labs significant for creatinine 1.48, glucose 94, normal CBC, troponin 121, CK3 09,. EKG shows sinus tachycardia, LVH and LAE. CXR shows no active disease.  MRI brain shows acute left MCA territory infarcts but no midline shift. 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 Pt received aspirin  324 mg x 1. Neurology was consulted for evaluation. TRH was consulted for admission.   Review of Systems: Please see HPI for pertinent positives and negatives. A complete 10 system review of systems are otherwise negative.  Past Medical History:  Diagnosis Date   Abnormal stress test    Low risk but abnormal stress test   DDD (degenerative disc disease)    Hypertension    Tobacco abuse    Past Surgical History:  Procedure Laterality Date   LUMBAR FUSION     Social History:   reports that he has been smoking. He does not have any smokeless tobacco history on file. He reports current alcohol use. No history on file for drug use.  No Known Allergies  No family history on file.   Prior to Admission medications   Medication Sig Start Date End Date Taking? Authorizing Provider  amLODipine  (NORVASC ) 5 MG tablet Take 1 tablet (5 mg total) by mouth daily. 01/07/22 04/07/22  Emil Share, DO  aspirin  81 MG chewable tablet Chew 81 mg by mouth every evening.    [provider]  losartan  (COZAAR ) 100 MG tablet Take 1 tablet (100 mg total) by mouth daily. 01/07/22 04/07/22  Emil Share, DO  meloxicam  (MOBIC ) 7.5 MG tablet Take 1 tablet (7.5 mg total) by mouth daily. 02/20/16   Rumalda Fleeting, PA-C    Physical Exam: BP (!) 177/89   Pulse 87   Temp (!) 97.5 F (36.4 C) (Axillary)   Resp 18   SpO2 91%  General: Pleasant, well-appearing elderly man laying in bed. No acute distress. HEENT: [place]/AT. Anicteric sclera.  MMM.  EOMI.  PERRLA. CV: RRR. No murmurs, rubs, or gallops. No LE edema Pulmonary: Lungs CTAB. Normal effort. No wheezing or rales. Abdominal: Soft, nontender, nondistended. Normal bowel sounds. Extremities: Palpable radial and DP pulses. Normal ROM. Skin: Warm and dry. No obvious rash or lesions. Neuro: A&Ox3. Follows simple commands.Poor comprehension. Expressive aphasia. Mild right facial droop. Full strength on the left, RUE 2/5, RLE 3/5. Unable to hold RUE/RLE against gravity. Normal sensation to light touch. Psych: Normal mood and affect  Labs on Admission:  Basic Metabolic Panel: Recent Labs  Lab 01/18/24 1922 01/18/24 1948  NA 135 136  K 4.1 4.2  CL 100 102  CO2 21*  --   GLUCOSE 94 98  BUN 17 19  CREATININE 1.48* 1.40*  CALCIUM  8.6*  --    Liver Function Tests: No results for input(s): AST, ALT, ALKPHOS, BILITOT, PROT, ALBUMIN in the last 168 hours. No results for input(s): LIPASE, AMYLASE in the last 168  hours. No results for input(s): AMMONIA in the last 168 hours. CBC: Recent Labs  Lab 01/18/24 1922 01/18/24 1948  WBC 8.9  --   HGB 16.2 15.6  HCT 47.7 46.0  MCV 96.4  --   PLT 240  --    Cardiac Enzymes: Recent Labs  Lab 01/18/24 1922  CKTOTAL 309   BNP (last 3 results) No results for input(s): BNP in the last 8760 hours.  ProBNP (last 3 results) No results for input(s): PROBNP in the last 8760 hours.  CBG: Recent Labs  Lab 01/18/24 1926  GLUCAP 105*    Radiological Exams on Admission: CT ANGIO HEAD NECK W WO CM Result Date: 01/18/2024 EXAM: CTA HEAD AND NECK WITH AND WITHOUT 01/18/2024 09:01:07 PM TECHNIQUE: CTA of the head and neck was performed with and without the administration of intravenous contrast. Multiplanar 2D and/or 3D reformatted images are provided for review. Automated exposure control, iterative reconstruction, and/or weight based adjustment of the mA/kV was utilized to reduce the radiation dose to as low as reasonably achievable. COMPARISON: None available CLINICAL HISTORY: Neuro deficit, acute, stroke suspected FINDINGS: AORTIC ARCH AND ARCH VESSELS: No dissection or arterial injury. No significant stenosis of the brachiocephalic or subclavian arteries. 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 Right carotid is patent without significant stenosis. CERVICAL VERTEBRAL ARTERIES: Moderate left vertebral artery origin stenosis. Otherwise, no significant stenosis. LUNGS AND MEDIASTINUM: Unremarkable. SOFT TISSUES: No acute abnormality. BONES: No acute abnormality. CTA HEAD: ANTERIOR CIRCULATION: Nonopacification of the majority of the left ICA. Reconstitution of the left paraclinoid ICA and proximal left M1 MCA with abrupt occlusion of the left mid M1 MCA. Poor distal MCA opacification/collaterals. Right MCA is patent. No significant stenosis of the anterior cerebral arteries. No significant stenosis of the middle  cerebral arteries. No aneurysm. POSTERIOR CIRCULATION: No significant stenosis of the posterior cerebral arteries. No significant stenosis of the basilar artery. No significant stenosis of the vertebral arteries. No aneurysm. OTHER: No dural venous sinus thrombosis on this non-dedicated study. Findings discussed Dr. Arora via telephone at 9:20 PM. IMPRESSION: 1. 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 2. Reconstitution of the left paraclinoid ICA and proximal left M1 MCA with abrupt occlusion of the left mid M1 MCA. Poor distal left MCA opacification/collaterals. Electronically signed by: Gilmore Molt MD 01/18/2024 09:32 PM EST RP Workstation: [id number]   MR BRAIN WO CONTRAST Result Date: 01/18/2024 EXAM: MRI Brain Without Contrast 01/18/2024 08:51:00 PM TECHNIQUE: Multiplanar multisequence MRI of the head/brain was performed without the administration of intravenous contrast. COMPARISON: None available. CLINICAL HISTORY: Mental status change, unknown cause; Neuro deficit, acute, stroke suspected FINDINGS: Motion limited study. BRAIN AND VENTRICLES: Acute left MCA territory infarcts including infarcts in the left basal ganglia, left insula, overlying left frontal and parietal lobes and to a lesser extent, the anterior left temporal lobe. Edema without midline shift. No intracranial hemorrhage. No mass. No hydrocephalus. Possible abnormal left ICA flow void. Moderate T2 hyperintensities in the white matter, compatible with chronic  microvascular ischemic change. ORBITS: No acute abnormality. SINUSES AND MASTOIDS: No acute abnormality. BONES AND SOFT TISSUES: Normal marrow signal. IMPRESSION: 1. Acute left MCA territory infarcts.  No midline shift. 2. Possible abnormal left ICA flow void. Please see forthcoming ordered CTA head/neck. Electronically signed by: Gilmore Molt MD 01/18/2024 09:11 PM EST RP Workstation: [id number]   DG Chest Port 1 View Result Date: 01/18/2024 CLINICAL DATA:  AMS EXAM: PORTABLE CHEST  - 1 VIEW COMPARISON:  01/07/2022 FINDINGS: Biapical pleural thickening. No focal airspace consolidation, pleural effusion, or pneumothorax. No cardiomegaly. Tortuous aorta with aortic atherosclerosis. No acute fracture or destructive lesions. Multilevel thoracic osteophytosis. Vascular calcification along the left neck. IMPRESSION: No acute cardiopulmonary abnormality. Electronically Signed   By: Rogelia Myers M.D.   On: 01/18/2024 19:41   Assessment/Plan Edward Phelps is a 63 y.o. male with medical history significant for hypertension, tobacco use disorder, DDD, and medication noncompliance who presented via EMS for evaluation of right-sided weakness, right-sided facial droop and aphasia found to have a large left MCA stroke  # Acute left MCA infarcts - Patient with history of uncontrolled hypertension and tobacco abuse presented with acute onset of right-sided weakness, right facial droop and expressive aphasia - MRI brain shows acute left MCA territory infarcts and CTA head and neck shows left ICA and left MI occlusion - Patient outside the window for thrombolysis or thrombectomy due to last well-known of 3 days ago - Neurology following, appreciate further recs - Follow-up echocardiogram, A1c and lipid panel - PT/OT/SLP eval and treat - Frequent neurochecks - Telemetry - N.p.o. until cleared by formal swallow evaluation by SLP  # Hypertensive emergency # Symptomatic hypertension - Patient with a history of uncontrolled hypertension presented with SBP in the 160s to 200s with brain imaging showing acute stroke - Likely in the setting of medication noncompliance - Start IV hydralazine  as needed for SBP > 180 - Resume amlodipine  and losartan  when able to take p.o. safely  # AKI - Creatinine slightly elevated to 1.48 likely due to mild dehydration - IV NS 100 cc/h for 1 day - Trend renal function avoid nephrotoxic agent  # Hx of DDD - Continue as needed Tylenol  - Apply lidocaine   patch to low back  # Tobacco use disorder - Chart review patient noted by PCP to be smoking 1 pack/day of cigarettes - Patient states he continues to smoke cigarettes but unable to quantify due to his aphasia - Need smoking cessation counseling   DVT prophylaxis: Lovenox      Code Status: Full Code  Consults called: Neurology  Family Communication: Discussed findings/results and plan for admission with his 2 sons at bedside  Severity of Illness: The appropriate patient status for this patient is INPATIENT. Inpatient status is judged to be reasonable and necessary in order to provide the required intensity of service to ensure the patient's safety. The patient's presenting symptoms, physical exam findings, and initial radiographic and laboratory data in the context of their chronic comorbidities is felt to place them at high risk for further clinical deterioration. Furthermore, it is not anticipated that the patient will be medically stable for discharge from the hospital within 2 midnights of admission.   * I certify that at the point of admission it is my clinical judgment that the patient will require inpatient hospital care spanning beyond 2 midnights from the point of admission due to high intensity of service, high risk for further deterioration and high frequency of surveillance required.*  Level of  care: Progressive    Lou Claretta HERO, MD 01/19/2024, 3:11 AM [facility] Pager: [phone] Isaiah 41:10   If 7PM-7AM, please contact night-coverage [email or web address] Password [email or web address]

## 2024-01-19 ENCOUNTER — Inpatient Hospital Stay (HOSPITAL_COMMUNITY): Payer: MEDICAID

## 2024-01-19 ENCOUNTER — Other Ambulatory Visit: Payer: Self-pay

## 2024-01-19 ENCOUNTER — Encounter (HOSPITAL_COMMUNITY): Payer: Self-pay | Admitting: Student

## 2024-01-19 DIAGNOSIS — N179 Acute kidney failure, unspecified: Secondary | ICD-10-CM

## 2024-01-19 DIAGNOSIS — I779 Disorder of arteries and arterioles, unspecified: Secondary | ICD-10-CM

## 2024-01-19 DIAGNOSIS — F17210 Nicotine dependence, cigarettes, uncomplicated: Secondary | ICD-10-CM

## 2024-01-19 DIAGNOSIS — I110 Hypertensive heart disease with heart failure: Secondary | ICD-10-CM

## 2024-01-19 DIAGNOSIS — I6389 Other cerebral infarction: Secondary | ICD-10-CM

## 2024-01-19 DIAGNOSIS — I509 Heart failure, unspecified: Secondary | ICD-10-CM

## 2024-01-19 DIAGNOSIS — R29721 NIHSS score 21: Secondary | ICD-10-CM

## 2024-01-19 DIAGNOSIS — E785 Hyperlipidemia, unspecified: Secondary | ICD-10-CM

## 2024-01-19 DIAGNOSIS — I69391 Dysphagia following cerebral infarction: Secondary | ICD-10-CM

## 2024-01-19 DIAGNOSIS — Z7982 Long term (current) use of aspirin: Secondary | ICD-10-CM

## 2024-01-19 DIAGNOSIS — F1010 Alcohol abuse, uncomplicated: Secondary | ICD-10-CM

## 2024-01-19 LAB — COMPREHENSIVE METABOLIC PANEL WITH GFR
ALT: 21 U/L (ref 0–44)
AST: 26 U/L (ref 15–41)
Albumin: 3.5 g/dL (ref 3.5–5.0)
Alkaline Phosphatase: 63 U/L (ref 38–126)
Anion gap: 15 (ref 5–15)
BUN: 17 mg/dL (ref 8–23)
CO2: 18 mmol/L — ABNORMAL LOW (ref 22–32)
Calcium: 8.7 mg/dL — ABNORMAL LOW (ref 8.9–10.3)
Chloride: 102 mmol/L (ref 98–111)
Creatinine, Ser: 1.26 mg/dL — ABNORMAL HIGH (ref 0.61–1.24)
GFR, Estimated: >60 mL/min (ref >60)
Glucose, Bld: 79 mg/dL (ref 70–99)
Potassium: 4.1 mmol/L (ref 3.5–5.1)
Sodium: 135 mmol/L (ref 135–145)
Total Bilirubin: 3.3 mg/dL — ABNORMAL HIGH (ref 0.0–1.2)
Total Protein: 6.6 g/dL (ref 6.5–8.1)

## 2024-01-19 LAB — BASIC METABOLIC PANEL WITH GFR
Anion gap: 18 — ABNORMAL HIGH (ref 5–15)
BUN: 11 mg/dL (ref 8–23)
CO2: 17 mmol/L — ABNORMAL LOW (ref 22–32)
Calcium: 8.5 mg/dL — ABNORMAL LOW (ref 8.9–10.3)
Chloride: 101 mmol/L (ref 98–111)
Creatinine, Ser: 1.18 mg/dL (ref 0.61–1.24)
GFR, Estimated: >60 mL/min (ref >60)
Glucose, Bld: 92 mg/dL (ref 70–99)
Potassium: 3.9 mmol/L (ref 3.5–5.1)
Sodium: 136 mmol/L (ref 135–145)

## 2024-01-19 LAB — HEMOGLOBIN A1C
Hgb A1c MFr Bld: 5.6 % (ref 4.8–5.6)
Mean Plasma Glucose: 114 mg/dL

## 2024-01-19 LAB — RAPID URINE DRUG SCREEN, HOSP PERFORMED
Amphetamines: NOT DETECTED
Barbiturates: NOT DETECTED
Benzodiazepines: NOT DETECTED
Cocaine: NOT DETECTED
Opiates: NOT DETECTED
Tetrahydrocannabinol: POSITIVE — AB

## 2024-01-19 LAB — ECHOCARDIOGRAM COMPLETE
Area-P 1/2: 6.9 cm2
Calc EF: 48.5 %
S' Lateral: 4.4 cm
Single Plane A2C EF: 60.4 %
Single Plane A4C EF: 34.7 %

## 2024-01-19 LAB — HIV ANTIBODY (ROUTINE TESTING W REFLEX): HIV Screen 4th Generation wRfx: NONREACTIVE

## 2024-01-19 LAB — MAGNESIUM: Magnesium: 2.1 mg/dL (ref 1.7–2.4)

## 2024-01-19 LAB — LIPID PANEL
Cholesterol: 200 mg/dL (ref 0–200)
HDL: 41 mg/dL (ref >40)
LDL Cholesterol: 133 mg/dL — ABNORMAL HIGH (ref 0–99)
Total CHOL/HDL Ratio: 4.9 ratio
Triglycerides: 130 mg/dL (ref <150)
VLDL: 26 mg/dL (ref 0–40)

## 2024-01-19 MED ORDER — AMLODIPINE BESYLATE 5 MG PO TABS
5.0000 mg | ORAL_TABLET | Freq: Every day | ORAL | Status: DC
  Administered 2024-01-20 – 2024-01-22 (×3): 5 mg via ORAL
  Filled 2024-01-19 (×3): qty 1

## 2024-01-19 MED ORDER — ASPIRIN 300 MG RE SUPP
300.0000 mg | Freq: Every day | RECTAL | Status: DC
  Filled 2024-01-19: qty 1

## 2024-01-19 MED ORDER — INFLUENZA VIRUS VACC SPLIT PF (FLUZONE) 0.5 ML IM SUSY
0.5000 mL | PREFILLED_SYRINGE | INTRAMUSCULAR | Status: AC
  Administered 2024-01-21: 0.5 mL via INTRAMUSCULAR
  Filled 2024-01-19: qty 0.5

## 2024-01-19 MED ORDER — PNEUMOCOCCAL 20-VAL CONJ VACC 0.5 ML IM SUSY
0.5000 mL | PREFILLED_SYRINGE | INTRAMUSCULAR | Status: DC
  Filled 2024-01-19: qty 0.5

## 2024-01-19 MED ORDER — LIDOCAINE 5 % EX PTCH
1.0000 | MEDICATED_PATCH | CUTANEOUS | Status: DC
  Administered 2024-01-19 – 2024-01-24 (×6): 1 via TRANSDERMAL
  Filled 2024-01-19 (×6): qty 1

## 2024-01-19 MED ORDER — LOSARTAN POTASSIUM 50 MG PO TABS
50.0000 mg | ORAL_TABLET | Freq: Every day | ORAL | Status: DC
  Administered 2024-01-20 – 2024-01-21 (×2): 50 mg via ORAL
  Filled 2024-01-19 (×2): qty 1

## 2024-01-19 MED ORDER — HYDRALAZINE HCL 20 MG/ML IJ SOLN
10.0000 mg | Freq: Four times a day (QID) | INTRAMUSCULAR | Status: DC | PRN
  Administered 2024-01-19 – 2024-01-22 (×5): 10 mg via INTRAVENOUS
  Filled 2024-01-19 (×6): qty 1

## 2024-01-19 MED ORDER — ASPIRIN 81 MG PO TBEC
81.0000 mg | DELAYED_RELEASE_TABLET | Freq: Every day | ORAL | Status: DC
  Administered 2024-01-20 – 2024-01-21 (×2): 81 mg via ORAL
  Filled 2024-01-19 (×2): qty 1

## 2024-01-19 MED ORDER — ATORVASTATIN CALCIUM 80 MG PO TABS
80.0000 mg | ORAL_TABLET | Freq: Every day | ORAL | Status: DC
  Administered 2024-01-20 – 2024-01-21 (×2): 80 mg via ORAL
  Filled 2024-01-19 (×2): qty 1

## 2024-01-19 NOTE — ED Notes (Signed)
 Echo at bedside

## 2024-01-19 NOTE — ED Notes (Signed)
 Pt provider yanker to suction sputum if necessary.

## 2024-01-19 NOTE — ED Notes (Signed)
 OT at bedside.

## 2024-01-19 NOTE — Progress Notes (Signed)
 Inpatient Rehab Admissions Coordinator Note:   Per therapy recommendations patient was screened for CIR candidacy by Reche FORBES Lowers, PT. At this time, pt appears to be a potential candidate for CIR. I will place an order for rehab consult for full assessment, per our protocol.  Note pt pre-admit living in hotel, PRN support per PT eval.  Will need to clarify caregiver support.  Please contact me any with questions.SABRA Reche Lowers, PT, DPT [phone] 01/19/24 10:48 AM

## 2024-01-19 NOTE — ED Notes (Signed)
  at bedside

## 2024-01-19 NOTE — Evaluation (Addendum)
 Speech Language Pathology Evaluation Patient Details Name: Edward Phelps MRN: [id number] DOB: 1961/01/02 Today's Date: 01/19/2024 Time: 9050-8994 SLP Time Calculation (min) (ACUTE ONLY): 16 min  Problem List:  Patient Active Problem List   Diagnosis Date Noted   Acute ischemic left MCA stroke (HCC) 01/18/2024   Past Medical History:  Past Medical History:  Diagnosis Date   Abnormal stress test    Low risk but abnormal stress test   DDD (degenerative disc disease)    Hypertension    Tobacco abuse    Past Surgical History:  Past Surgical History:  Procedure Laterality Date   LUMBAR FUSION     HPI:  Patient is a 63 y.o. male who presented to the hospital via EMS on 01/18/24 after his boss from work  went to check on him and found him sitting on the floor with right facial droop and unable to talk. In ED, CXR did not show any active disease, EKG showed sinus tachycardia, MRI brain showed acute left MCA territory infarcts but no midline shift. 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 He has been NPO since admitted after failing [facility] swallow screen with RN. PMH: HTN, heavy tobacco use, heavy ETOH use, chronic low back pain, DDD, medication non-compliance.   Assessment / Plan / Recommendation Clinical Impression  Patient presents with a global aphasia as per this evaluation. He was alert and his son and daughter in law were at bedside. Patient unable to follow one step commands. SLP provided modeling and tactile cues but that did not support patient's ability to follow commands. Patient continued to give head nods yes to all yes/no questions. He exhibited instances of spontaneous vocalizations but no verbalizations heard and no attempts to initiate non-verbal communication. Patient did give SLP questionable look when SLP asked if patient lived in [place] , but patient nodded head yes to this question. He was able to perform familiar tasks with left UE such as: brushing  his teeth independently with the toothbrush after SLP brought patient's hand to his mouth, holding cupcup and bringing to mouth to drink. (Patient was not impulsive with the cup of water). SLP will continue to follow for aphasia and cognitive treatment.     SLP Assessment  SLP Recommendation/Assessment: Patient needs continued Speech Language Pathology Services SLP Visit Diagnosis: Apraxia (R48.2)     Assistance Recommended at Discharge  Other (comment) (TBD)  Functional Status Assessment Patient has had a recent decline in their functional status and demonstrates the ability to make significant improvements in function in a reasonable and predictable amount of time.  Frequency and Duration min 2x/week  1 week      SLP Evaluation Cognition  Overall Cognitive Status: Impaired/Different from baseline Arousal/Alertness: Awake/alert Attention: Focused Focused Attention: Impaired Focused Attention Impairment: Functional basic Awareness: Impaired Awareness Impairment: Intellectual impairment       Comprehension  Auditory Comprehension Overall Auditory Comprehension: Impaired Yes/No Questions: Impaired Basic Biographical Questions: 51-75% accurate Commands: Impaired One Step Basic Commands: 0-24% accurate    Expression Expression Primary Mode of Expression: Verbal Verbal Expression Overall Verbal Expression: Impaired Initiation: Impaired Non-Verbal Means of Communication: Other (comment) (Head nods)   Oral / Motor  Oral Motor/Sensory Function Overall Oral Motor/Sensory Function: Generalized oral weakness Facial ROM: Reduced right Facial Symmetry: Abnormal symmetry right Facial Strength: Reduced right Facial Sensation: Reduced right Lingual Symmetry: Abnormal symmetry right Motor Speech Overall Motor Speech: Impaired Respiration: Impaired           Damien Hy  Graduate SLP Clinican

## 2024-01-19 NOTE — ED Notes (Signed)
 Neurology at bedside.

## 2024-01-19 NOTE — Evaluation (Signed)
 Occupational Therapy Evaluation Patient Details Name: Edward Phelps MRN: [id number] DOB: 02/02/1961 Today's Date: 01/19/2024   History of Present Illness   63 yo M adm 01/18/24 after found on floor by friend with Rt weakness and aphasia with Lt MCA infarcts. Outside LKW time for intervention. PMhx: HTN, tobacco use, DDD, lumbar fusion.     Clinical Impressions Pt admitted with the above diagnosis and has the deficits outlined below. Pt would benefit from continued extensive OT to increase independence with basic adls to the point where pt could possibly d/c home with family to assist. 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  Pt will now need 24 7 assist after d/c and feel he would benefit from greater than 3 hours a day of therapy prior to returning home if he has 24/7 assist from his son.  Will continue to see and will update d/c plan when information becomes available.     If plan is discharge home, recommend the following:   Two people to help with walking and/or transfers;Two people to help with bathing/dressing/bathroom;Assistance with cooking/housework;Assistance with feeding;Direct supervision/assist for medications management;Direct supervision/assist for financial management;Assist for transportation;Help with stairs or ramp for entrance;Supervision due to cognitive status     Functional Status Assessment   Patient has had a recent decline in their functional status and demonstrates the ability to make significant improvements in function in a reasonable and predictable amount of time.     Equipment Recommendations   Other (comment) (tbd)     Recommendations for Other Services   Rehab consult     Precautions/Restrictions   Precautions Precautions: Fall;Other (comment) Recall of Precautions/Restrictions:  Impaired Precaution/Restrictions Comments: Rt inattention, Rt hemiparesis, expressive and receptive aphasia Restrictions Weight Bearing Restrictions Per Provider Order: No     Mobility Bed Mobility Overal bed mobility: Needs Assistance Bed Mobility: Rolling, Supine to Sit, Sit to Supine     Supine to sit: Mod assist Sit to supine: Mod assist   General bed mobility comments: rolling right with min assist, rolling left mod assist. supine to sit with mod assist to clear legs and elevate trunk, pt with posterior bias in sitting. Return to supine with mod assist to clear legs and control trunk to surface. Max +2 to slide to [facility]    Transfers Overall transfer level: Needs assistance Equipment used: 2 person hand held assist Transfers: Sit to/from Stand Sit to Stand: Mod assist, +2 safety/equipment, +2 physical assistance, From elevated surface           General transfer comment: pt stood from stretcher x 2 trials with bil knee blocked. Pt able to push up to rise with LLLE with decreased extension and control of RLE, Rt ankle inversion in standing.      Balance Overall balance assessment: Needs assistance Sitting-balance support: No upper extremity supported, Feet supported Sitting balance-Leahy Scale: Poor Sitting balance - Comments: min-mod assist for sitting balance with posterior bias Postural control: Posterior lean Standing balance support: Bilateral upper extremity supported Standing balance-Leahy Scale: Poor Standing balance comment: mod +2 assist in standing                           ADL either performed or assessed with clinical judgement   ADL Overall ADL's : Needs assistance/impaired Eating/Feeding: NPO   Grooming: Wash/dry hands;Wash/dry face;Moderate assistance;Bed level  Upper Body Bathing: Moderate assistance;Sitting   Lower Body Bathing: Maximal assistance;Bed level   Upper Body Dressing : Maximal assistance;Sitting   Lower Body Dressing:  Maximal assistance;Sit to/from stand   Toilet Transfer: Maximal assistance;+2 for physical assistance;Stand-pivot;BSC/3in1   Toileting- Clothing Manipulation and Hygiene: Total assistance;+2 for physical assistance       Functional mobility during ADLs: Maximal assistance;+2 for safety/equipment General ADL Comments: Pt limited by aphasia, cognitive deficits and R hemiplegia     Vision Baseline Vision/History: 0 No visual deficits Ability to See in Adequate Light: 1 Impaired Vision Assessment?: Vision impaired- to be further tested in functional context Additional Comments: Pt appears to have R inattention but also had difficulty finding people and items at midline.     Perception Perception: Impaired Preception Impairment Details: Inattention/Neglect Perception-Other Comments: R inattention to environment and body   Praxis Praxis: Impaired Praxis Impairment Details: Organization, Motor planning     Pertinent Vitals/Pain Pain Assessment Pain Assessment: Faces Faces Pain Scale: No hurt Pain Location: pt would grimace at times but communication limited. did not appear to be in any pain.     Extremity/Trunk Assessment Upper Extremity Assessment Upper Extremity Assessment: Right hand dominant;RUE deficits/detail RUE Deficits / Details: no active movement noted RUE Sensation: decreased light touch RUE Coordination: decreased fine motor;decreased gross motor   Lower Extremity Assessment Lower Extremity Assessment: Defer to PT evaluation RLE Deficits / Details: grossly 2/5 difficult to accurately assess given what appears receptive aphasia, pt can bend knee and hip in supine with repeated multimodal cues. limited stance in standing. Rt ankle inversion in weight bearing. pt without withdrawal to stimuli on bottom of foot but does extend great toe as compared to LLE with immediate withdrawal touching foot   Cervical / Trunk Assessment Cervical / Trunk Assessment: Neck Surgery;Other  exceptions (chronic pack pain)   Communication Communication Communication: Impaired Factors Affecting Communication: Difficulty expressing self   Cognition Arousal: Alert Behavior During Therapy: Flat affect Cognition: Cognition impaired, Difficult to assess Difficult to assess due to: Impaired communication       Attention impairment (select first level of impairment): Focused attention Executive functioning impairment (select all impairments): Problem solving, Sequencing, Organization, Initiation, Reasoning OT - Cognition Comments: Pt appears somewhat globally aphasic. pt would  answer a few questions spontaneously but yes nos not accurate therefore very difficult to evaluate cogntion at this time.                 Following commands: Impaired Following commands impaired: Follows one step commands inconsistently, Follows one step commands with increased time     Cueing  General Comments   Cueing Techniques: Verbal cues;Gestural cues;Tactile cues  Pt overall requiring a great amount of assist for all adls due to hemiplegia and aphasia   Exercises     Shoulder Instructions      Home Living Family/patient expects to be discharged to:: Private residence (motel) Living Arrangements: Alone Available Help at Discharge: Family;Available PRN/intermittently Type of Home: Other(Comment) (motel) Home Access: Level entry     Home Layout: One level     Bathroom Shower/Tub: Chief Strategy Officer: Standard     Home Equipment: None          Prior Functioning/Environment Prior Level of Function : Independent/Modified Independent;Working/employed;Driving             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 ADLs Comments: independent    OT Problem List: Decreased strength;Decreased range of motion;Decreased activity tolerance;Impaired balance (sitting  and/or standing);Impaired vision/perception;Decreased coordination;Decreased cognition;Decreased safety  awareness;Decreased knowledge of use of DME or AE;Decreased knowledge of precautions;Impaired sensation;Impaired tone;Impaired UE functional use;Pain   OT Treatment/Interventions: Self-care/ADL training;Neuromuscular education;DME and/or AE instruction;Therapeutic activities;Balance training;Cognitive remediation/compensation;Visual/perceptual remediation/compensation      OT Goals(Current goals can be found in the care plan section)   Acute Rehab OT Goals Patient Stated Goal: none stated OT Goal Formulation: With family Time For Goal Achievement: 02/02/24 Potential to Achieve Goals: Good ADL Goals Pt Will Perform Eating: with min assist;with supervision;sitting Pt Will Perform Grooming: with min assist;sitting Pt Will Perform Upper Body Dressing: with min assist;sitting Pt Will Transfer to Toilet: with mod assist;stand pivot transfer;bedside commode Additional ADL Goal #1: Pt will maintain balance on EOB for 5 min with supervision in prep for adls on EOB   OT Frequency:  Min 2X/week    Co-evaluation PT/OT/SLP Co-Evaluation/Treatment: Yes Reason for Co-Treatment: Complexity of the patient's impairments (multi-system involvement);For patient/therapist safety PT goals addressed during session: Mobility/safety with mobility;Balance OT goals addressed during session: ADL's and self-care      AM-PAC OT 6 Clicks Daily Activity     Outcome Measure Help from another person eating meals?: Total Help from another person taking care of personal grooming?: A Lot Help from another person toileting, which includes using toliet, bedpan, or urinal?: A Lot Help from another person bathing (including washing, rinsing, drying)?: A Lot Help from another person to put on and taking off regular upper body clothing?: A Lot Help from another person to put on and taking off regular lower body clothing?: A Lot 6 Click Score: 11   End of Session Equipment Utilized During Treatment: Gait belt Nurse  Communication: Mobility status  Activity Tolerance: Patient tolerated treatment well Patient left: in bed;with call bell/phone within reach;with family/visitor present  OT Visit Diagnosis: Unsteadiness on feet (R26.81);Other abnormalities of gait and mobility (R26.89);Hemiplegia and hemiparesis;Other symptoms and signs involving cognitive function;Other symptoms and signs involving the nervous system (R29.898);Cognitive communication deficit (R41.841);Muscle weakness (generalized) (M62.81) Symptoms and signs involving cognitive functions: Cerebral infarction Hemiplegia - Right/Left: Right Hemiplegia - dominant/non-dominant: Dominant Hemiplegia - caused by: Cerebral infarction                Time: 9147-9089 OT Time Calculation (min): 18 min Charges:  OT General Charges $OT Visit: 1 Visit OT Evaluation $OT Eval Moderate Complexity: 1 Mod  Joshua Silvano Dragon 01/19/2024, 11:11 AM

## 2024-01-19 NOTE — Progress Notes (Addendum)
 STROKE TEAM PROGRESS NOTE    SIGNIFICANT HOSPITAL EVENTS Patient was found down yesterday by his boss.  Unknown last known well sometime between Thursday and Monday  INTERIM HISTORY/SUBJECTIVE Family at the bedside.  MRI brain with acute left MCA infarct.  Per son at the bedside he is a heavy beer drinker and heavy smoker unable to give us  amount that he uses daily.  Son also endorses that he uses THC.  Son states he does not PCP and has not been to a doctor in many years. will check UDS.  And also put him on CIWA protocol monitoring for alcohol withdrawal.     CBC    Component Value Date/Time   WBC 8.9 01/18/2024 1922   RBC 4.95 01/18/2024 1922   HGB 15.6 01/18/2024 1948   HCT 46.0 01/18/2024 1948   PLT 240 01/18/2024 1922   MCV 96.4 01/18/2024 1922   MCH 32.7 01/18/2024 1922   MCHC 34.0 01/18/2024 1922   RDW 13.4 01/18/2024 1922    BMET    Component Value Date/Time   NA 135 01/19/2024 0225   K 4.1 01/19/2024 0225   CL 102 01/19/2024 0225   CO2 18 (L) 01/19/2024 0225   GLUCOSE 79 01/19/2024 0225   BUN 17 01/19/2024 0225   CREATININE 1.26 (H) 01/19/2024 0225   CALCIUM  8.7 (L) 01/19/2024 0225   GFRNONAA >60 01/19/2024 0225    IMAGING past 24 hours ECHOCARDIOGRAM COMPLETE Result Date: 01/19/2024    ECHOCARDIOGRAM REPORT   Patient Name:   Edward Phelps Date of Exam: 01/19/2024 Medical Rec #:  [id number]        Height:       70.0 in Accession #:    [id number]       Weight:       170.0 lb Date of Birth:  04/03/1960       BSA:          1.948 m Patient Age:    63 years         BP:           121/68 mmHg Patient Gender: M                HR:           91 bpm. Exam Location:  Inpatient Procedure: 2D Echo and Saline Contrast Bubble Study (Both Spectral and Color            Flow Doppler were utilized during procedure). Indications:   Stroke  History:       Patient has no prior history of Echocardiogram examinations.                Stroke.  Sonographer:   Norleen Amour Referring       [phone] PROSPER M AMPONSAH Phys: IMPRESSIONS  1. Left ventricular ejection fraction, by estimation, is 45 to 50%. The left ventricle has mildly decreased function. The left ventricle demonstrates regional wall motion abnormalities (see scoring diagram/findings for description). Left ventricular diastolic parameters are indeterminate.  2. Right ventricular systolic function is normal. The right ventricular size is normal. Tricuspid regurgitation signal is inadequate for assessing PA pressure.  3. The mitral valve is grossly normal. Trivial mitral valve regurgitation. No evidence of mitral stenosis.  4. The aortic valve is grossly normal. Aortic valve regurgitation is not visualized. No aortic stenosis is present.  5. The inferior vena cava is normal in size with greater than 50% respiratory variability, suggesting right atrial pressure of 3  mmHg.  6. Agitated saline contrast bubble study was negative, with no evidence of any interatrial shunt. FINDINGS  Left Ventricle: Left ventricular ejection fraction, by estimation, is 45 to 50%. The left ventricle has mildly decreased function. The left ventricle demonstrates regional wall motion abnormalities. The left ventricular internal cavity size was normal in size. There is no left ventricular hypertrophy. Left ventricular diastolic parameters are indeterminate.  LV Wall Scoring: The posterior wall is hypokinetic. Right Ventricle: The right ventricular size is normal. No increase in right ventricular wall thickness. Right ventricular systolic function is normal. Tricuspid regurgitation signal is inadequate for assessing PA pressure. Left Atrium: Left atrial size was normal in size. Right Atrium: Right atrial size was normal in size. Pericardium: There is no evidence of pericardial effusion. Mitral Valve: The mitral valve is grossly normal. Trivial mitral valve regurgitation. No evidence of mitral valve stenosis. Tricuspid Valve: The tricuspid valve is grossly normal.  Tricuspid valve regurgitation is trivial. No evidence of tricuspid stenosis. Aortic Valve: The aortic valve is grossly normal. Aortic valve regurgitation is not visualized. No aortic stenosis is present. Pulmonic Valve: The pulmonic valve was not well visualized. Pulmonic valve regurgitation is not visualized. Aorta: The aortic root and ascending aorta are structurally normal, with no evidence of dilitation. Venous: The inferior vena cava is normal in size with greater than 50% respiratory variability, suggesting right atrial pressure of 3 mmHg. IAS/Shunts: The atrial septum is grossly normal. Agitated saline contrast was given intravenously to evaluate for intracardiac shunting. Agitated saline contrast bubble study was negative, with no evidence of any interatrial shunt.  LEFT VENTRICLE PLAX 2D LVIDd:         5.80 cm      Diastology LVIDs:         4.40 cm      LV e' medial:    5.59 cm/s LV PW:         1.10 cm      LV E/e' medial:  17.4 LV IVS:        1.10 cm      LV e' lateral:   10.10 cm/s LVOT diam:     1.89 cm      LV E/e' lateral: 9.7 LV SV:         46 LV SV Index:   24 LVOT Area:     2.81 cm  LV Volumes (MOD) LV vol d, MOD A2C: 114.0 ml LV vol d, MOD A4C: 103.0 ml LV vol s, MOD A2C: 45.2 ml LV vol s, MOD A4C: 67.3 ml LV SV MOD A2C:     68.8 ml LV SV MOD A4C:     103.0 ml LV SV MOD BP:      53.2 ml RIGHT VENTRICLE            IVC RV Basal diam:  3.00 cm    IVC diam: 1.47 cm RV S prime:     8.84 cm/s TAPSE (M-mode): 1.5 cm LEFT ATRIUM             Index        RIGHT ATRIUM           Index LA diam:        4.34 cm 2.23 cm/m   RA Area:     12.80 cm LA Vol (A2C):   54.9 ml 28.18 ml/m  RA Volume:   31.70 ml  16.27 ml/m LA Vol (A4C):   37.3 ml 19.15 ml/m LA Biplane Vol: 46.6 ml  23.92 ml/m  AORTIC VALVE             PULMONIC VALVE LVOT Vmax:   83.00 cm/s  PV Vmax:       0.95 m/s LVOT Vmean:  56.000 cm/s PV Peak grad:  3.6 mmHg LVOT VTI:    0.164 m  AORTA Ao Root diam: 2.91 cm Ao Asc diam:  3.12 cm MITRAL VALVE MV  Area (PHT): 6.90 cm    SHUNTS MV Decel Time: 110 msec    Systemic VTI:  0.16 m MV E velocity: 97.50 cm/s  Systemic Diam: 1.89 cm MV A velocity: 45.60 cm/s MV E/A ratio:  2.14 Darryle Decent MD Electronically signed by Darryle Decent MD Signature Date/Time: 01/19/2024/10:53:23 AM    Final    CT ANGIO HEAD NECK W WO CM Result Date: 01/18/2024 EXAM: CTA HEAD AND NECK WITH AND WITHOUT 01/18/2024 09:01:07 PM TECHNIQUE: CTA of the head and neck was performed with and without the administration of intravenous contrast. Multiplanar 2D and/or 3D reformatted images are provided for review. Automated exposure control, iterative reconstruction, and/or weight based adjustment of the mA/kV was utilized to reduce the radiation dose to as low as reasonably achievable. COMPARISON: None available CLINICAL HISTORY: Neuro deficit, acute, stroke suspected FINDINGS: AORTIC ARCH AND ARCH VESSELS: No dissection or arterial injury. No significant stenosis of the brachiocephalic or subclavian arteries. 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 Right carotid is patent without significant stenosis. CERVICAL VERTEBRAL ARTERIES: Moderate left vertebral artery origin stenosis. Otherwise, no significant stenosis. LUNGS AND MEDIASTINUM: Unremarkable. SOFT TISSUES: No acute abnormality. BONES: No acute abnormality. CTA HEAD: ANTERIOR CIRCULATION: Nonopacification of the majority of the left ICA. Reconstitution of the left paraclinoid ICA and proximal left M1 MCA with abrupt occlusion of the left mid M1 MCA. Poor distal MCA opacification/collaterals. Right MCA is patent. No significant stenosis of the anterior cerebral arteries. No significant stenosis of the middle cerebral arteries. No aneurysm. POSTERIOR CIRCULATION: No significant stenosis of the posterior cerebral arteries. No significant stenosis of the basilar artery. No significant stenosis of the vertebral arteries. No aneurysm. OTHER: No  dural venous sinus thrombosis on this non-dedicated study. Findings discussed Dr. Arora via telephone at 9:20 PM. IMPRESSION: 1. 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 2. Reconstitution of the left paraclinoid ICA and proximal left M1 MCA with abrupt occlusion of the left mid M1 MCA. Poor distal left MCA opacification/collaterals. Electronically signed by: Gilmore Molt MD 01/18/2024 09:32 PM EST RP Workstation: [id number]   MR BRAIN WO CONTRAST Result Date: 01/18/2024 EXAM: MRI Brain Without Contrast 01/18/2024 08:51:00 PM TECHNIQUE: Multiplanar multisequence MRI of the head/brain was performed without the administration of intravenous contrast. COMPARISON: None available. CLINICAL HISTORY: Mental status change, unknown cause; Neuro deficit, acute, stroke suspected FINDINGS: Motion limited study. BRAIN AND VENTRICLES: Acute left MCA territory infarcts including infarcts in the left basal ganglia, left insula, overlying left frontal and parietal lobes and to a lesser extent, the anterior left temporal lobe. Edema without midline shift. No intracranial hemorrhage. No mass. No hydrocephalus. Possible abnormal left ICA flow void. Moderate T2 hyperintensities in the white matter, compatible with chronic microvascular ischemic change. ORBITS: No acute abnormality. SINUSES AND MASTOIDS: No acute abnormality. BONES AND SOFT TISSUES: Normal marrow signal. IMPRESSION: 1. Acute left MCA territory infarcts.  No midline shift. 2. Possible abnormal left ICA flow void. Please see forthcoming ordered CTA head/neck. Electronically signed by: Gilmore Molt MD 01/18/2024 09:11 PM EST RP Workstation: [id number]  DG Chest Port 1 View Result Date: 01/18/2024 CLINICAL DATA:  AMS EXAM: PORTABLE CHEST - 1 VIEW COMPARISON:  01/07/2022 FINDINGS: Biapical pleural thickening. No focal airspace consolidation, pleural effusion, or pneumothorax. No cardiomegaly. Tortuous aorta with aortic atherosclerosis. No acute fracture or destructive  lesions. Multilevel thoracic osteophytosis. Vascular calcification along the left neck. IMPRESSION: No acute cardiopulmonary abnormality. Electronically Signed   By: Rogelia Myers M.D.   On: 01/18/2024 19:41    Vitals:   01/19/24 0536 01/19/24 0545 01/19/24 0700 01/19/24 0830  BP:  (!) 157/94 (!) 182/96 (!) 184/107  Pulse:  87 85 96  Resp:  17 19 18   Temp: 98.6 F (37 C)     TempSrc: Oral     SpO2:  96% 96% 97%     PHYSICAL EXAM General: Lethargic, well-developed patient in no acute distress Psych:  Mood and affect appropriate for situation CV: Regular rate and rhythm on monitor Respiratory:  Regular, unlabored respirations on room air GI: Abdomen soft and nontender   NEURO:  Mental Status: Drowsy, eyes are closed, wakes up to voice.  He is globally aphasic questionable whether he can follow very simple commands.  Was able to make a fist only once.  Does not mimic  Cranial Nerves:  II: PERRL. Visual fields with no blink to threat on the right III, IV, VI: EOM with left gaze preference however will cross midline but not fully cross to right gaze V: Sensation is intact to light touch and symmetrical to face.  VII: Right facial VIII: hearing intact to voice. IX, X: Palate elevates symmetrically. Phonation is normal.  KP:Dynloizm shrug 5/5. XII: tongue is midline without fasciculations. Motor: 5/5 strength in left arm and left leg, withdraws in right arm and leg Tone: is normal and bulk is normal Sensation- Intact to noxious stimuli as above Coordination: Unable to assess Gait- deferred  Most Recent NIH  1a Level of Conscious.: 1 1b LOC Questions: 2 1c LOC Commands: 2 2 Best Gaze: 1 3 Visual: 1 4 Facial Palsy: 2 5a Motor Arm - left: 0 5b Motor Arm - Right: 3 6a Motor Leg - Left: 0 6b Motor Leg - Right: 3 7 Limb Ataxia: 0 8 Sensory: 0 9 Best Language: 2 10 Dysarthria: 3 11 Extinct. and Inatten.: 1 TOTAL: 21   ASSESSMENT/PLAN  Mr. Edward Phelps is a 62  y.o. male with history of  hypertension, tobacco abuse, chronic low back pain due to old back injury at work, presenting for evaluation of right-sided weakness and inability to talk as well as right-sided facial drooping which was noticed by family today.  Last known well when he came back from work Friday and has since not been spoken to.   NIH on Admission 13  Stroke:  left MCA territory infarcts, etiology: Likely large vessel disease MRI  Acute left MCA territory infarcts.  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 Reconstitution of the left paraclinoid ICA and proximal left M1 MCA with abrupt occlusion of the left mid M1 MCA. Poor distal left MCA opacification/collaterals. 2D Echo EF 45 to 50% LDL 133 HgbA1c pending UDS pending VTE prophylaxis -Lovenox  aspirin  81 mg daily prior to admission, now on aspirin  300 mg suppository daily given on po access Therapy recommendations:  CIR Disposition: Pending  Hypertension Home meds: Amlodipine  5 mg, losartan  100 mg (not sure about compliance) Stable on the high end Avoid low BP Gradually normalize BP in 3-5 days Long term BP goal normotensive  Hyperlipidemia Home meds: None LDL 133, goal < 70 Add atorvastatin  80 once po access Continue statin at discharge  Tobacco Abuse ETOH abuse  Patient smokes a lot per son, and has many beers during the day  Smoking cessation and alcohol limitation education will be provided CIWA protocol Thiamine  and folate and multivitamin daily DT and seizure precaution  Substance Abuse Patient uses THC intermittently UDS pending TOC consult for cessation placed  Dysphagia Patient has post-stroke dysphagia, SLP consulted Currently n.p.o. May consider core Trak tomorrow for tube feeding  Other Stroke Risk Factors Congestive heart failure  Other Active Problems AKI, creatinine 1.4-1.26, on IVF  Hospital day # 1   Karna Geralds DNP, ACNPC-AG  [facility]  ATTENDING NOTE: I  reviewed above note and agree with the assessment and plan. Pt was seen and examined.   Son and daughter in law are at the bedside. Pt sleepy but open eyes after voice, able to maintain eye opening during round. However, global aphasia and not able to answer questions. Not following simple commands or pantomime except closed eyes on command. Not able to name or repeat. Eyes left gaze preference but cross midline, R gaze incomplete, R visual neglect. R facial droop. Tongue protrusion not cooperative. LUE and LLE seems to be 5/5, RUE and RLE mild withdraw to pain. Sensation, coordination and gait not tested   For detailed assessment and plan, please refer to above as I have made changes wherever appropriate.   Edward Cummins, MD PhD Stroke Neurology 01/19/2024 3:26 PM  I discussed with Dr. Drusilla and speech therapist. I spent extensive total face-to-face time with the patient and son and daughter-in-law, reviewing test results, images and medication, and discussing the diagnosis, treatment plan and potential prognosis. This patient's care requiresreview of multiple databases, neurological assessment, discussion with family, other specialists and medical decision making of high complexity.      To contact Stroke Continuity provider, please refer to [email or web address]. After hours, contact General Neurology

## 2024-01-19 NOTE — ED Notes (Signed)
 RN switched BP cuff to other arm to verify elevated BP.

## 2024-01-19 NOTE — Plan of Care (Signed)

## 2024-01-19 NOTE — Progress Notes (Addendum)
 [facility]  PROGRESS NOTE  Edward Phelps FMW:[id number] DOB: 10/21/1960 DOA: 01/18/2024 PCP: Medicine, [facility]   Brief HPI:   63 y.o. male with medical history significant for hypertension, tobacco use disorder, chronic low back pain, DDD, and medication noncompliance who presented via EMS for evaluation of right-sided weakness, right-sided facial droop and aphasia.  Patient with expressive aphasia so history obtained from sons at bedside. They report that patient was last well-known on Friday. He did not go to work today so his boss checked on him and found him sitting on the floor with right facial droop and inability to talk so he called EMS.  MRI brain shows acute left MCA territory infarcts but no midline shift. CTA head and neck shows occluded left ICA at its origin  Neurology was consulted   Assessment/Plan:   Acute left MCA infarct   Presented with-.  Right-sided weakness, found to have left MCA infarct and MRI brain - CT head and neck showed left ICA and left M1 occlusion - Patient outside the window of thrombolytic therapy as last known well was 2 to 3 days ago -Started on aspirin  81 mg p.o. 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 - Creatinine elevated at 1.48 on admission - Creatinine has improved to 1.26  History of degenerative disc disease - Lidocaine  patch - Tylenol  as needed  Tobacco use disorder - Continues to smoke cigarettes - Will need smoking cessation counseling done stable  DVT prophylaxis: Lovenox   Medications     amLODipine   5 mg Oral Daily   [START ON 01/20/2024] aspirin   300 mg Rectal Daily   Or    [START ON 01/20/2024] aspirin  EC  81 mg Oral Daily   atorvastatin   80 mg Oral Daily   enoxaparin  (LOVENOX ) injection  40 mg Subcutaneous Q24H   lidocaine   1 patch Transdermal Q24H   losartan   50 mg Oral Daily     Data Reviewed:   CBG:  Recent Labs  Lab 01/18/24 1926  GLUCAP 105*    SpO2: 95 %    Vitals:   01/19/24 0830 01/19/24 1100 01/19/24 1200 01/19/24 1208  BP: (!) 184/107 (!) 171/97 (!) 169/93   Pulse: 96 90 93   Resp: 18 18 12    Temp:    97.9 F (36.6 C)  TempSrc:    Axillary  SpO2: 97% 95% 95%       Data Reviewed:  Basic Metabolic Panel: Recent Labs  Lab 01/18/24 1922 01/18/24 1948 01/19/24 0225  NA 135 136 135  K 4.1 4.2 4.1  CL 100 102 102  CO2 21*  --  18*  GLUCOSE 94 98 79  BUN 17 19 17   CREATININE 1.48* 1.40* 1.26*  CALCIUM  8.6*  --  8.7*    CBC: Recent Labs  Lab 01/18/24 1922 01/18/24 1948  WBC 8.9  --   HGB 16.2 15.6  HCT 47.7 46.0  MCV 96.4  --   PLT 240  --     LFT Recent Labs  Lab 01/19/24 0225  AST 26  ALT 21  ALKPHOS 63  BILITOT 3.3*  PROT 6.6  ALBUMIN 3.5     Antibiotics: Anti-infectives (  From admission, onward)    None        CONSULTS neurology  Code Status: Full code  Family Communication: Discussed with patient's son at bedside     Subjective   Continues to have right-sided weakness and aphasia   Objective    Physical Examination:   Appears in no acute distress Alert, following some commands Neuro-weakness of right upper and lower extremity, 5/5 in left upper and lower extremity, aphasia Abdomen is soft, nontender            Edward Phelps   [facility] If 7PM-7AM, please contact night-coverage at [email or web address], Office  [phone]   01/19/2024, 1:43 PM  LOS: 1 day

## 2024-01-19 NOTE — Plan of Care (Incomplete)
 Son and daughter in law are at the bedside. Pt sleepy but open eyes after voice, able to maintain eye opening during round. However, global aphasia and not able to answer questions. Not following simple commands or pantomime except closed eyes on command. Not able to name or repeat. Eyes left gaze preference but cross midline, R gaze incomplete, R visual neglect. R facial droop. Tongue protrusion not cooperative. LUE and LLE seems to be 5/5, RUE and RLE mild withdraw to pain. Sensation, coordination and gait not tested.

## 2024-01-19 NOTE — ED Notes (Signed)
 RN slid pt up in bed.

## 2024-01-19 NOTE — Evaluation (Signed)
 Physical Therapy Evaluation Patient Details Name: Edward Phelps MRN: [id number] DOB: [birth year] Today's Date: 01/19/2024  History of Present Illness  63 yo M adm 01/18/24 after found on floor by friend with Rt weakness and aphasia with Lt MCA infarcts. Outside LKW time for intervention. PMhx: HTN, tobacco use, DDD, lumbar fusion.  Clinical Impression  Pt seen in ED with son Edward Phelps present to provide home setup and PLOF. 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 Pt with Rt inattention, inconsistent correct responses with yes/no answers and pt with improved responses with head nods when answering accurately. Pt RHD with decreased sensation, strength and function of RLE and RUE. Pt with decreased functional transfers, balance and mobility who will benefit from acute therapy to maximize mobility, safety and function. Edward Phelps unsure how much support family will be able to provide but he plans to discuss with brother. Patient will benefit from intensive inpatient follow-up therapy, >3 hours/day  BP 185/88 in sitting and pt returned to supine end of session        If plan is discharge home, recommend the following: A lot of help with walking and/or transfers;A lot of help with bathing/dressing/bathroom;Assistance with cooking/housework;Direct supervision/assist for medications management;Assist for transportation;Supervision due to cognitive status;Assistance with feeding;Direct supervision/assist for financial management;Help with stairs or ramp for entrance   Can travel by private vehicle        Equipment Recommendations Wheelchair (measurements PT);Wheelchair cushion (measurements PT);BSC/3in1  Recommendations for Other Services  Rehab consult;Speech consult    Functional Status Assessment Patient has had a recent decline in their functional status and demonstrates the ability to make significant improvements in function in a reasonable and predictable amount of time.      Precautions / Restrictions Precautions Precautions: Fall;Other (comment) Recall of Precautions/Restrictions: Impaired Precaution/Restrictions Comments: Rt inattention, Rt hemiparesis, expressive and receptive aphasia      Mobility  Bed Mobility Overal bed mobility: Needs Assistance Bed Mobility: Rolling, Supine to Sit, Sit to Supine     Supine to sit: Mod assist Sit to supine: Mod assist   General bed mobility comments: rolling right with min assist, rolling left mod assist. supine to sit with mod assist to clear legs and elevate trunk, pt with posterior bias in sitting. Return to supine with mod assist to clear legs and control trunk to surface. Max +2 to slide to [facility]    Transfers Overall transfer level: Needs assistance   Transfers: Sit to/from Stand Sit to Stand: Mod assist, +2 safety/equipment, +2 physical assistance, From elevated surface           General transfer comment: pt stood from stretcher x 2 trials with bil knee blocked. Pt able to push up to rise with LLLE with decreased extension and control of RLE, Rt ankle inversion in standing.    Ambulation/Gait                  Stairs            Wheelchair Mobility     Tilt Bed    Modified Rankin (Stroke Patients Only) Modified Rankin (Stroke Patients Only) Pre-Morbid Rankin Score: No symptoms Modified Rankin: Severe disability     Balance Overall balance assessment: Needs assistance Sitting-balance support: No upper extremity supported, Feet supported Sitting balance-Leahy Scale: Poor Sitting balance - Comments: min-mod assist for sitting balance with posterior bias   Standing balance support: Bilateral upper extremity supported Standing balance-Leahy Scale: Poor Standing balance comment: mod +2 assist in  standing                             Pertinent Vitals/Pain Pain Assessment Pain Assessment: CPOT Facial Expression: Relaxed, neutral Body Movements: Absence of  movements Muscle Tension: Relaxed Compliance with ventilator (intubated pts.): N/A Vocalization (extubated pts.): Talking in normal tone or no sound CPOT Total: 0 Pain Intervention(s): Repositioned, Monitored during session    Home Living Family/patient expects to be discharged to:: Private residence Living Arrangements: Alone Available Help at Discharge: Family;Available PRN/intermittently Type of Home: Other(Comment) (hotel) Home Access: Level entry       Home Layout: One level Home Equipment: None      Prior Function Prior Level of Function : Independent/Modified Independent;Working/employed;Driving             Mobility Comments: worked as a Ecologist Extremity Assessment: Defer to OT evaluation    Lower Extremity Assessment Lower Extremity Assessment: RLE deficits/detail RLE Deficits / Details: grossly 2/5 difficult to accurately assess given what appears receptive aphasia, pt can bend knee and hip in supine with repeated multimodal cues. limited stance in standing. Rt ankle inversion in weight bearing. pt without withdrawal to stimuli on bottom of foot but does extend great toe as compared to LLE with immediate withdrawal touching foot    Cervical / Trunk Assessment Cervical / Trunk Assessment: Normal  Communication   Communication Communication: Impaired Factors Affecting Communication: Difficulty expressing self    Cognition Arousal: Alert Behavior During Therapy: Flat affect   PT - Cognitive impairments: Difficult to assess, Awareness, Problem solving, Safety/Judgement, Initiation Difficult to assess due to: Impaired communication                       Following commands: Impaired Following commands impaired: Follows one step commands inconsistently, Follows one step commands with increased time     Cueing Cueing Techniques: Verbal cues, Gestural cues, Tactile cues      General Comments      Exercises     Assessment/Plan    PT Assessment Patient needs continued PT services  PT Problem List Decreased strength;Decreased mobility;Decreased safety awareness;Decreased range of motion;Decreased coordination;Decreased knowledge of precautions;Decreased activity tolerance;Decreased cognition;Decreased balance;Decreased knowledge of use of DME       PT Treatment Interventions Gait training;DME instruction;Therapeutic exercise;Balance training;Neuromuscular re-education;Functional mobility training;Cognitive remediation;Therapeutic activities;Patient/family education    PT Goals (Current goals can be found in the Care Plan section)  Acute Rehab PT Goals Patient Stated Goal: return to functional mobility PT Goal Formulation: With family Time For Goal Achievement: 02/02/24 Potential to Achieve Goals: Good    Frequency Min 3X/week     Co-evaluation PT/OT/SLP Co-Evaluation/Treatment: Yes Reason for Co-Treatment: Complexity of the patient's impairments (multi-system involvement);For patient/therapist safety PT goals addressed during session: Mobility/safety with mobility;Balance         AM-PAC PT 6 Clicks Mobility  Outcome Measure Help needed turning from your back to your side while in a flat bed without using bedrails?: A Lot Help needed moving from lying on your back to sitting on the side of a flat bed without using bedrails?: A Lot Help needed moving to and from a bed to a chair (including a wheelchair)?: Total Help needed standing up from a chair using your arms (e.g., wheelchair or bedside chair)?: Total Help needed to walk in hospital room?: Total Help needed climbing 3-5  steps with a railing? : Total 6 Click Score: 8    End of Session Equipment Utilized During Treatment: Gait belt Activity Tolerance: Patient tolerated treatment well Patient left: in bed;with call bell/phone within reach;with family/visitor present Nurse Communication:  Mobility status PT Visit Diagnosis: Other abnormalities of gait and mobility (R26.89);Difficulty in walking, not elsewhere classified (R26.2);Muscle weakness (generalized) (M62.81);Hemiplegia and hemiparesis Hemiplegia - Right/Left: Right Hemiplegia - dominant/non-dominant: Dominant Hemiplegia - caused by: Cerebral infarction    Time: 0843-0909 PT Time Calculation (min) (ACUTE ONLY): 26 min   Charges:   PT Evaluation $PT Eval Moderate Complexity: 1 Mod   PT General Charges $$ ACUTE PT VISIT: 1 Visit         Lenoard SQUIBB, PT Acute Rehabilitation Services Office: [phone]   Lenoard NOVAK Ivette Castronova 01/19/2024, 10:11 AM

## 2024-01-19 NOTE — Evaluation (Signed)
 Clinical/Bedside Swallow Evaluation Patient Details  Name: Edward Phelps MRN: [id number] Date of Birth: [birth year]  Today's Date: 01/19/2024 Time: SLP Start Time (ACUTE ONLY): 1005 SLP Stop Time (ACUTE ONLY): 1022 SLP Time Calculation (min) (ACUTE ONLY): 17 min  Past Medical History:  Past Medical History:  Diagnosis Date   Abnormal stress test    Low risk but abnormal stress test   DDD (degenerative disc disease)    Hypertension    Tobacco abuse    Past Surgical History:  Past Surgical History:  Procedure Laterality Date   LUMBAR FUSION     HPI:  Patient is a 63 y.o. male who presented to the hospital via EMS on 01/18/24 after his boss from work  went to check on him and found him sitting on the floor with right facial droop and unable to talk. In ED, CXR did not show any active disease, EKG showed sinus tachycardia, MRI brain showed acute left MCA territory infarcts but no midline shift. 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 He has been NPO since admitted after failing [facility] swallow screen with RN. PMH: HTN, heavy tobacco use, heavy ETOH use, chronic low back pain, DDD, medication non-compliance.    Assessment / Plan / Recommendation  Clinical Impression  Patient presents with clinical s/s of dysphagia as per this BSE. He was awake, alert, and participated fully in this evaluation. Patient's vocalizations appeared congested throughout the session. Patient was edentulous but son indicated that patient does wear dentures, they are just not at hospital. SLP set up toothbrush for patient and placed in patient's hand. SLP supported patient's hand to his mouth and patient independently brushed his teeth. After oral care, SLP assessed patient's swallow via PO's of: ice chips and thin liquids. Patient exhibited some facial grimmacing with ice chips but did not appear to be in pain. When given cup of water, patient independently brought cup to mouth and took cup sips of  water. Right anterior spillage was observed with each cup sip. Delayed throat clearing was noted following cup sips. SLP is recommending patient continue NPO status and will schedule MBS to further assess patient's swallow. SLP will continue to follow. SLP Visit Diagnosis: Dysphagia, unspecified (R13.10)    Aspiration Risk  Mild aspiration risk    Diet Recommendation NPO    Medication Administration: Via alternative means    Other  Recommendations Oral Care Recommendations: Oral care QID     Assistance Recommended at Discharge Other (comment) (TBD)  Functional Status Assessment Patient has had a recent decline in their functional status and demonstrates the ability to make significant improvements in function in a reasonable and predictable amount of time.  Frequency and Duration min 2x/week  1 week       Prognosis Prognosis for improved oropharyngeal function: Fair      Swallow Study   General Date of Onset: 01/19/24 HPI: Patient is a 63 y.o. male who presented to the hospital via EMS on 01/18/24 after his boss from work  went to check on him and found him sitting on the floor with right facial droop and unable to talk. In ED, CXR did not show any active disease, EKG showed sinus tachycardia, MRI brain showed acute left MCA territory infarcts but no midline shift. 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 He has been NPO since admitted after failing [facility] swallow screen with RN. PMH: HTN, heavy tobacco use, heavy ETOH use, chronic low back pain, DDD,  medication non-compliance. Type of Study: Bedside Swallow Evaluation Diet Prior to this Study: NPO Temperature Spikes Noted: No Respiratory Status: Room air History of Recent Intubation: No Behavior/Cognition: Alert;Cooperative;Requires cueing;Doesn't follow directions Oral Cavity Assessment: Within Functional Limits Oral Care Completed by SLP: Yes Oral Cavity - Dentition: Dentures, top;Dentures, bottom;Missing  dentition Vision: Functional for self-feeding Self-Feeding Abilities: Able to feed self;Needs set up Patient Positioning: Upright in bed Volitional Cough: Congested    Oral/Motor/Sensory Function Overall Oral Motor/Sensory Function: Moderate impairment Facial ROM: Reduced right Facial Symmetry: Abnormal symmetry right Facial Strength: Reduced right Facial Sensation: Reduced right Lingual Symmetry: Abnormal symmetry right   Ice Chips Ice chips: Within functional limits Presentation: Spoon   Thin Liquid Thin Liquid: Impaired Presentation: Self Fed;Cup Oral Phase Impairments: Reduced labial seal Oral Phase Functional Implications: Right anterior spillage Pharyngeal  Phase Impairments: Throat Clearing - Delayed    Nectar Thick     Honey Thick     Puree     Solid           Damien Hy  Graduate SLP Clinican

## 2024-01-19 NOTE — ED Notes (Signed)
 Speech at bedside

## 2024-01-19 NOTE — Progress Notes (Signed)
  Echocardiogram 2D Echocardiogram has been performed.  Norleen ORN [facility] 01/19/2024, 9:53 AM

## 2024-01-19 NOTE — ED Notes (Signed)
 Pt was attempting to get out of bed d/t feeling a little restless. RN and NT repositioned pt in bed. RN explained PT/OT will be by in the morning to assess his needs and at this time it isn't safe for him to move around. Pt laid back in bed and resting comfortably. Family at bedside and informed to hit call bell if pt attempts to leave.

## 2024-01-20 ENCOUNTER — Inpatient Hospital Stay (HOSPITAL_COMMUNITY): Payer: MEDICAID

## 2024-01-20 ENCOUNTER — Encounter (HOSPITAL_COMMUNITY): Payer: Self-pay | Admitting: Student

## 2024-01-20 DIAGNOSIS — I63512 Cerebral infarction due to unspecified occlusion or stenosis of left middle cerebral artery: Secondary | ICD-10-CM

## 2024-01-20 DIAGNOSIS — F1210 Cannabis abuse, uncomplicated: Secondary | ICD-10-CM

## 2024-01-20 DIAGNOSIS — E440 Moderate protein-calorie malnutrition: Secondary | ICD-10-CM | POA: Insufficient documentation

## 2024-01-20 LAB — GLUCOSE, CAPILLARY
Glucose-Capillary: 109 mg/dL — ABNORMAL HIGH (ref 70–99)
Glucose-Capillary: 121 mg/dL — ABNORMAL HIGH (ref 70–99)
Glucose-Capillary: 150 mg/dL — ABNORMAL HIGH (ref 70–99)

## 2024-01-20 LAB — BASIC METABOLIC PANEL WITH GFR
Anion gap: 14 (ref 5–15)
BUN: 13 mg/dL (ref 8–23)
CO2: 19 mmol/L — ABNORMAL LOW (ref 22–32)
Calcium: 8.7 mg/dL — ABNORMAL LOW (ref 8.9–10.3)
Chloride: 104 mmol/L (ref 98–111)
Creatinine, Ser: 1.12 mg/dL (ref 0.61–1.24)
GFR, Estimated: >60 mL/min (ref >60)
Glucose, Bld: 88 mg/dL (ref 70–99)
Potassium: 3.7 mmol/L (ref 3.5–5.1)
Sodium: 137 mmol/L (ref 135–145)

## 2024-01-20 LAB — MAGNESIUM: Magnesium: 2.2 mg/dL (ref 1.7–2.4)

## 2024-01-20 LAB — PHOSPHORUS: Phosphorus: 2.9 mg/dL (ref 2.5–4.6)

## 2024-01-20 MED ORDER — THIAMINE MONONITRATE 100 MG PO TABS
100.0000 mg | ORAL_TABLET | Freq: Every day | ORAL | Status: DC
  Administered 2024-01-21 – 2024-01-24 (×4): 100 mg
  Filled 2024-01-20 (×4): qty 1

## 2024-01-20 MED ORDER — DEXTROSE-SODIUM CHLORIDE 5-0.9 % IV SOLN: INTRAVENOUS | Status: DC

## 2024-01-20 MED ORDER — OSMOLITE 1.5 CAL PO LIQD
1000.0000 mL | ORAL | Status: DC
  Administered 2024-01-20 – 2024-01-23 (×2): 1000 mL
  Filled 2024-01-20: qty 1000

## 2024-01-20 MED ORDER — NICOTINE 14 MG/24HR TD PT24
14.0000 mg | MEDICATED_PATCH | Freq: Every day | TRANSDERMAL | Status: DC
  Administered 2024-01-20 – 2024-01-23 (×4): 14 mg via TRANSDERMAL
  Filled 2024-01-20 (×4): qty 1

## 2024-01-20 MED ORDER — PROSOURCE TF20 ENFIT COMPATIBL EN LIQD
60.0000 mL | Freq: Every day | ENTERAL | Status: DC
  Administered 2024-01-20 – 2024-01-24 (×5): 60 mL
  Filled 2024-01-20 (×5): qty 60

## 2024-01-20 NOTE — Progress Notes (Signed)
 Physical Therapy Treatment Patient Details Name: Edward Phelps MRN: [id number] DOB: 1960/09/14 Today's Date: 01/20/2024   History of Present Illness 63 yo M adm 01/18/24 after found on floor by friend with Rt weakness and aphasia with Lt MCA infarcts. Outside LKW time for intervention. PMhx: HTN, tobacco use, DDD, lumbar fusion.   PT Comments  Pt received in supine and agreeable to PT session. Pt was unable to verbalize and would use head nods to communicate. Worked on bed mobility with assist needed to bring LLE underneath R ankle to guide on/off the bed. Pt unable to maintain the position without assist. Once seated on the EOB, pt had multidirectional losses of balance requiring ModA to TotalA to correct. Worked on seated balance with reaching activities with increased difficulty reaching to the right with noted R inattention throughout. Able to perform x2 stands with initial ModAx2 progressing to MaxAx2 with fatigue. Pt required B LE's blocked with R LE buckling and L LE moving into a valgus position. Required assist to weight-shift with pt unable to step or pivot with LLE. Deferred transferring to the recliner as pt was scheduled for cortrak placement after PT session. Continue to recommend >3hrs post acute rehab with acute PT to follow.     If plan is discharge home, recommend the following: A lot of help with walking and/or transfers;A lot of help with bathing/dressing/bathroom;Assistance with cooking/housework;Direct supervision/assist for medications management;Assist for transportation;Supervision due to cognitive status;Assistance with feeding;Direct supervision/assist for financial management;Help with stairs or ramp for entrance   Can travel by private vehicle      No  Equipment Recommendations  Wheelchair (measurements PT);Wheelchair cushion (measurements PT);BSC/3in1       Precautions / Restrictions Precautions Precautions: Fall;Other (comment) Recall of  Precautions/Restrictions: Impaired Precaution/Restrictions Comments: Rt inattention, Rt hemiparesis, expressive and receptive aphasia Restrictions Weight Bearing Restrictions Per Provider Order: No     Mobility  Bed Mobility Overal bed mobility: Needs Assistance Bed Mobility: Rolling, Supine to Sit, Sit to Supine    Supine to sit: Mod assist, +2 for physical assistance Sit to supine: Max assist   General bed mobility comments: assist needed to bring L LE underneath R ankle to guide towards EOB. Unable to keep position without assist. ModAx2 also needed to raise trunk. ModA for return to supine for LE management    Transfers Overall transfer level: Needs assistance Equipment used: 2 person hand held assist Transfers: Sit to/from Stand Sit to Stand: Mod assist, +2 safety/equipment, +2 physical assistance, From elevated surface, Max assist    General transfer comment: ModAx2 from EOB with BLE's blocked. R LE buckling with L LE moving into valgus. Assist to weight-shift with pt unable to step with LLE    Modified Rankin (Stroke Patients Only) Modified Rankin (Stroke Patients Only) Pre-Morbid Rankin Score: No symptoms Modified Rankin: Severe disability     Balance Overall balance assessment: Needs assistance Sitting-balance support: No upper extremity supported, Feet supported Sitting balance-Leahy Scale: Poor Sitting balance - Comments: multidirectional losses of balance requiring ModA to TotalA Postural control: Posterior lean, Right lateral lean, Left lateral lean Standing balance support: Bilateral upper extremity supported, During functional activity, Reliant on assistive device for balance Standing balance-Leahy Scale: Poor Standing balance comment: mod +2 assist in standing       Communication Communication Communication: Impaired Factors Affecting Communication: Difficulty expressing self  Cognition Arousal: Alert Behavior During Therapy: Flat affect   PT -  Cognitive impairments: Difficult to assess, Awareness, Problem solving, Safety/Judgement, Initiation Difficult to  assess due to: Impaired communication    PT - Cognition Comments: Unable to verbalize with pt nodding head yes/no in response to questions. Responded better with one step, simple commands. Decreased attention to R side Following commands: Impaired Following commands impaired: Follows one step commands inconsistently, Follows one step commands with increased time    Cueing Cueing Techniques: Verbal cues, Gestural cues, Tactile cues  Exercises General Exercises - Lower Extremity Ankle Circles/Pumps: PROM, Right, 10 reps, Supine Long Arc Quad: AAROM, Right, 10 reps, Seated Heel Slides: AAROM, Right, 10 reps, Supine Other Exercises Other Exercises: x2 sit<>stand, ModAx2 w/ [facility] Other Exercises: Reaching activities with L hand. x5 rotating from the left to right, x5 rotating from right to left        Pertinent Vitals/Pain Pain Assessment Pain Assessment: Faces Faces Pain Scale: Hurts little more Pain Location: L LE with WB Pain Descriptors / Indicators: Grimacing Pain Intervention(s): Monitored during session, Limited activity within patient's tolerance, Repositioned     PT Goals (current goals can now be found in the care plan section) Acute Rehab PT Goals PT Goal Formulation: With family Time For Goal Achievement: 02/02/24 Potential to Achieve Goals: Good Progress towards PT goals: Progressing toward goals    Frequency    Min 3X/week       AM-PAC PT 6 Clicks Mobility   Outcome Measure  Help needed turning from your back to your side while in a flat bed without using bedrails?: A Lot Help needed moving from lying on your back to sitting on the side of a flat bed without using bedrails?: A Lot Help needed moving to and from a bed to a chair (including a wheelchair)?: Total Help needed standing up from a chair using your arms (e.g., wheelchair or bedside  chair)?: Total Help needed to walk in hospital room?: Total Help needed climbing 3-5 steps with a railing? : Total 6 Click Score: 8    End of Session Equipment Utilized During Treatment: Gait belt Activity Tolerance: Patient tolerated treatment well Patient left: in bed;with call bell/phone within reach;with bed alarm set Nurse Communication: Mobility status;Need for lift equipment PT Visit Diagnosis: Other abnormalities of gait and mobility (R26.89);Difficulty in walking, not elsewhere classified (R26.2);Muscle weakness (generalized) (M62.81);Hemiplegia and hemiparesis Hemiplegia - Right/Left: Right Hemiplegia - dominant/non-dominant: Dominant Hemiplegia - caused by: Cerebral infarction     Time: 1100-1126 PT Time Calculation (min) (ACUTE ONLY): 26 min  Charges:    $Therapeutic Exercise: 8-22 mins $Therapeutic Activity: 8-22 mins PT General Charges $$ ACUTE PT VISIT: 1 Visit                    Kate ORN, PT, DPT Secure Chat Preferred  Rehab Office [phone]   Kate BRAVO Wendolyn 01/20/2024, 12:48 PM

## 2024-01-20 NOTE — Progress Notes (Signed)
 Initial Nutrition Assessment  DOCUMENTATION CODES:   Non-severe (moderate) malnutrition in context of social or environmental circumstances  INTERVENTION:  Initiate tube feeding via Cortrak: Osmolite 1.5 at 55 ml/h ( per day) Initiate at 25ml/hr and increase by 10ml/hr q8h until goal rate achieved Prosource TF20 60 ml daily  Provides 2060 kcal, 103 gm protein, 1006 ml free water daily   Monitor magnesium, potassium, and phosphorus daily for at least 3 days, MD to replete as needed, as pt is at risk for refeeding syndrome given malnutrition dx and inability to confirm level of intake PTA.   Add Thiamine  100 mg daily for 5 days   Needs new weight collected to assess trend   Monitor SLP notes for diet advancement and assess tolerance   NUTRITION DIAGNOSIS:  Moderate Malnutrition related to social / environmental circumstances (prolonged inadequate energy intake) as evidenced by mild muscle depletion, mild fat depletion.  GOAL:  Patient will meet greater than or equal to 90% of their needs   MONITOR:  TF tolerance, Labs  REASON FOR ASSESSMENT:   Consult Enteral/tube feeding initiation and management  ASSESSMENT:   Pt with PMH significant HTN, tobacco use disorder and DDD. Admitted for acute ischemic left MCA stroke.  Patient down for MBS today and found to have dysphagia. Remains NPO and consult placed for Cortrak placement and initiation of enteral nutrition support. He has expressive aphasia and, therefore, could not provide detailed nutrition history.  11/25 - admitted 11/26 - MBS: NPO, Cortrak placed, TF started  Does state he was eating normally prior to admission which was 2-3 meals per day. 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 He is uninsured. Given inability to confirm level of intake prior to admission, will monitor for refeeding and start thiamine  prophylactically. Dentition adequate. No wounds.  Admit/Current  Weight: 77.1 kg  Cannot accurately assess recent weight trend as no weight history since 2017. In fact, weight on file appears pulled forward from last encounter in 2017. Will request new weight to assess trend and actual body weight. No significant edema on exam. No BM yet. Will monitor.   Medications reviewed and unremarkable. Continues on dextrose  until TF slated to start.    Labs: Na+ 137 (wdl) T bili 3.3 (H) CBGs 88-92 x24 hours A1c 5.6 (12/2023)  NUTRITION - FOCUSED PHYSICAL EXAM: While he currently meets criteria for malnutrition in the context of social/environmental setting based off likely prolonged inadequate energy intake, he may be appropriate for more pronounced degree of malnutrition if able to confirm recent weight trend or reported intake level with time frame.   Flowsheet Row Most Recent Value  Orbital Region Mild depletion  Upper Arm Region Mild depletion  Thoracic and Lumbar Region Moderate depletion  Buccal Region No depletion  Temple Region Mild depletion  Clavicle Bone Region Mild depletion  Clavicle and Acromion Bone Region Moderate depletion  Scapular Bone Region Moderate depletion  Dorsal Hand No depletion  Patellar Region No depletion  Anterior Thigh Region No depletion  Posterior Calf Region No depletion  Edema (RD Assessment) None  Hair Reviewed  Eyes Reviewed  Mouth Reviewed  Skin Reviewed  Nails Reviewed     Diet Order:   Diet Order             Diet NPO time specified Except for: Ice Chips  Diet effective now             EDUCATION NEEDS:   No education  needs have been identified at this time  Skin:  Skin Assessment: Reviewed RN Assessment  Last BM:  PTA  Height:  Ht Readings from Last 1 Encounters:  01/19/24 5' 10 (1.778 m)   Weight:  Wt Readings from Last 1 Encounters:  01/19/24 77.1 kg   Ideal Body Weight:  75.5 kg  BMI:  Body mass index is 24.39 kg/m.  Estimated Nutritional Needs:   Kcal:  1900-2100  kcal  Protein:  90-105g  Fluid:  >2L/day  Blair Deaner MS, RD, LDN Registered Dietitian Clinical Nutrition RD Inpatient Contact Info in Amion

## 2024-01-20 NOTE — Procedures (Signed)
 Cortrak  Person Inserting Tube:  Mady Dolly, RD Tube Type:  Cortrak - 43 inches Tube Size:  10 Tube Location:  Left nare Secured by: Bridle Initial Placement:  Gastric Technique Used to Measure Tube Placement:  Marking at nare/corner of mouth Cortrak Secured At:  66 cm   Cortrak Tube Team Note:  Consult received to place a Cortrak feeding tube.   No x-ray is required. RN may begin using tube.   If the tube becomes dislodged please keep the tube and contact the Cortrak team at [email or web address] for replacement.  If after hours and replacement cannot be delayed, place a NG tube and confirm placement with an abdominal x-ray.   Dolly Mady MS, RD, LDN Registered Dietitian Clinical Nutrition RD Inpatient Contact Info in Amion

## 2024-01-20 NOTE — TOC Initial Note (Signed)
 Transition of Care [facility]) - Initial/Assessment Note    Patient Details  Name: Edward Phelps MRN: [id number] Date of Birth: 21-Nov-1960  [facility]) CM/SW Contact:    Almarie CHRISTELLA Goodie, LCSW Phone Number: 01/20/2024, 2:11 PM  Clinical Narrative:      Patient from motel alone, working but no insurance. CSW updated by rehab admissions that patient's family requesting SNF, will need workup for Medicaid and Disability for long term care placement. CSW sent information to financial counseling to reach out to son for Medicaid/Disability workup, they are out of office today. CSW to follow for SNF workup after financial counseling evaluation.             Expected Discharge Plan: Skilled Nursing Facility Barriers to Discharge: Continued Medical Work up, Homeless with medical needs, Inadequate or no insurance, SNF Pending bed offer, SNF Pending Medicaid, SNF Pending payor source - LOG   Patient Goals and CMS Choice Patient states their goals for this hospitalization and ongoing recovery are:: patient unable to participate in goal setting, not fully oriented CMS Medicare.gov Compare Post Acute Care list provided to:: Patient Represenative (must comment) Choice offered to / list presented to : Adult Children [place] ownership interest in [facility].provided to:: Adult Children    Expected Discharge Plan and Services     Post Acute Care Choice: Skilled Nursing Facility Living arrangements for the past 2 months: Hotel/Motel                                      Prior Living Arrangements/Services Living arrangements for the past 2 months: Hotel/Motel Lives with:: Self Patient language and need for interpreter reviewed:: No Do you feel safe going back to the place where you live?: Yes      Need for Family Participation in Patient Care: Yes (Comment) Care giver support system in place?: No (comment)   Criminal Activity/Legal Involvement Pertinent to  Current Situation/Hospitalization: No - Comment as needed  Activities of Daily Living   ADL Screening (condition at time of admission) Independently performs ADLs?: No Does the patient have a NEW difficulty with bathing/dressing/toileting/self-feeding that is expected to last >3 days?: Yes (Initiates electronic notice to provider for possible OT consult) Does the patient have a NEW difficulty with getting in/out of bed, walking, or climbing stairs that is expected to last >3 days?: Yes (Initiates electronic notice to provider for possible PT consult) Does the patient have a NEW difficulty with communication that is expected to last >3 days?: Yes (Initiates electronic notice to provider for possible SLP consult) Is the patient deaf or have difficulty hearing?: No Does the patient have difficulty seeing, even when wearing glasses/contacts?: No Does the patient have difficulty concentrating, remembering, or making decisions?: Yes  Permission Sought/Granted Permission sought to share information with : Facility Medical Sales Representative, Family Supports Permission granted to share information with : Yes, Verbal Permission Granted  Share Information with NAME: [name]  Permission granted to share info w AGENCY: SNF  Permission granted to share info w Relationship: Son     Emotional Assessment   Attitude/Demeanor/Rapport: Unable to Assess Affect (typically observed): Unable to Assess Orientation: : Fluctuating Orientation (Suspected and/or reported Sundowners) Alcohol / Substance Use: Alcohol Use Psych Involvement: No (comment)  Admission diagnosis:  Acute ischemic left MCA stroke (HCC) [I63.512] Acute CVA (cerebrovascular accident) [facility]) [I63.9] Patient Active Problem List   Diagnosis Date Noted  Acute ischemic left MCA stroke (HCC) 01/18/2024   PCP:  Medicine, [facility]:   [id number] - [place], [place] - [address] [place] [address] Phone: [phone] Fax: [phone]     Social Drivers of Health (SDOH) Social History: SDOH Screenings   Food Insecurity: Food Insecurity Present (01/19/2024)  Housing: Unknown (01/19/2024)  Transportation Needs: Unmet Transportation Needs (01/19/2024)  Utilities: Not At Risk (01/19/2024)  Financial Resource Strain: High Risk (05/27/2022)   Received from [facility]  Social Connections: Unknown (05/22/2022)   Received from [facility]  Tobacco Use: High Risk (01/19/2024)   SDOH Interventions:     Readmission Risk Interventions     No data to display

## 2024-01-20 NOTE — Progress Notes (Signed)
 PROGRESS NOTE  Edward Phelps FMW:[id number] DOB: 1960-07-11 DOA: 01/18/2024 PCP: Medicine, [facility]   LOS: 2 days   Brief narrative:  63 y.o. male with medical history significant for hypertension, tobacco use disorder, chronic low back pain, DDD, and medication noncompliance presented to hospital with right-sided weakness facial droop and aphasia.  MRI brain showed acute left MCA territory infarcts but no midline shift. 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 Neurology was consulted and patient presented to hospital for further evaluation and treatment   Assessment/Plan: Principal Problem:   Acute ischemic left MCA stroke (HCC)  Acute left MCA infarct MRI with left MCA infarct. 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  Patient was outside the window for thrombolytic treatment and has been started on aspirin .  Neurology on board.  2D echocardiogram with LV ejection fraction of 45 to 50% with regional wall motion abnormality and indeterminate diastolic dysfunction.  No evidence of interatrial shunt.  Hemoglobin A1c of 5.6, lipid panel was notable for LDL of 133.  Patient has been seen by physical therapy and recommended CIR at this time.  Patient has been seen by speech therapy and recommend n.p.o. at this time.  Plan for modified barium swallow.  Continue statins 80 mg daily.  Patient will benefit from cortrak tube insertion since patient has performed dysphagia.  Spoke with the patient about it and he is agreeable with this.  Will order cortrak tube tube.  Until that time we will continue D5 normal saline.   Essential hypertension Blood pressure was uncontrolled on presentation secondary to noncompliance to her hypertension.  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  At this time creatinine has improved to 1.1.     History of degenerative disc disease Continue Tylenol   and Lidoderm  patch.   Tobacco use disorder Quitting smoking reinforced, added nicotine  patch.  DVT prophylaxis: enoxaparin  (LOVENOX ) injection 40 mg Start: 01/19/24 1000   Disposition: Likely to CIR as per PT evaluation  Status is: Inpatient Remains inpatient appropriate because: Pending clinical improvement, need for rehabilitation, n.p.o. status.    Code Status:     Code Status: Full Code  Family Communication: None at bedside  Consultants: Neurology  Procedures: None  Anti-infectives:  None  Anti-infectives (From admission, onward)    None        Subjective: Today, patient was seen and examined at bedside.  Has expressive aphasia and mildly verbal but indistinct speech.  Complains of discomfort in the right side of the body.  Objective: Vitals:   01/20/24 0500 01/20/24 0504  BP: (!) 165/70 (!) 157/80  Pulse: 80   Resp: 17   Temp: 98 F (36.7 C)   SpO2: 98%     Intake/Output Summary (Last 24 hours) at 01/20/2024 1037 Last data filed at 01/20/2024 0500 Gross per 24 hour  Intake 864.73 ml  Output 500 ml  Net 364.73 ml   Filed Weights   01/19/24 1608  Weight: 77.1 kg   Body mass index is 24.39 kg/m.   Physical Exam: GENERAL: Patient is alert awake and follows commands, expressive aphasia, not in obvious distress. HENT: No scleral pallor or icterus. Pupils equally reactive to light. Oral mucosa is moist NECK: is supple, no gross swelling noted. CHEST: Clear to auscultation. No crackles or wheezes.  Diminished breath sounds bilaterally. CVS: S1 and S2 heard, no murmur. Regular rate and rhythm.  ABDOMEN: Soft, non-tender, bowel sounds are present. EXTREMITIES: No edema. CNS right facial weakness right-sided weakness SKIN: warm and dry without rashes.  Data Review: I have personally reviewed the following laboratory data and studies,  CBC: Recent Labs  Lab 01/18/24 1922 01/18/24 1948  WBC 8.9  --   HGB 16.2 15.6  HCT 47.7 46.0  MCV 96.4   --   PLT 240  --    Basic Metabolic Panel: Recent Labs  Lab 01/18/24 1922 01/18/24 1948 01/19/24 0225 01/19/24 2051 01/20/24 0108  NA 135 136 135 136 137  K 4.1 4.2 4.1 3.9 3.7  CL 100 102 102 101 104  CO2 21*  --  18* 17* 19*  GLUCOSE 94 98 79 92 88  BUN 17 19 17 11 13   CREATININE 1.48* 1.40* 1.26* 1.18 1.12  CALCIUM  8.6*  --  8.7* 8.5* 8.7*  MG  --   --   --  2.1  --    Liver Function Tests: Recent Labs  Lab 01/19/24 0225  AST 26  ALT 21  ALKPHOS 63  BILITOT 3.3*  PROT 6.6  ALBUMIN 3.5   No results for input(s): LIPASE, AMYLASE in the last 168 hours. No results for input(s): AMMONIA in the last 168 hours. Cardiac Enzymes: Recent Labs  Lab 01/18/24 1922  CKTOTAL 309   BNP (last 3 results) No results for input(s): BNP in the last 8760 hours.  ProBNP (last 3 results) No results for input(s): PROBNP in the last 8760 hours.  CBG: Recent Labs  Lab 01/18/24 1926  GLUCAP 105*   No results found for this or any previous visit (from the past 240 hours).   Studies: ECHOCARDIOGRAM COMPLETE Result Date: 01/19/2024    ECHOCARDIOGRAM REPORT   Patient Name:   Edward Phelps Date of Exam: 01/19/2024 Medical Rec #:  [id number]        Height:       70.0 in Accession #:    [id number]       Weight:       170.0 lb Date of Birth:  04/24/1960       BSA:          1.948 m Patient Age:    63 years         BP:           121/68 mmHg Patient Gender: M                HR:           91 bpm. Exam Location:  Inpatient Procedure: 2D Echo and Saline Contrast Bubble Study (Both Spectral and Color            Flow Doppler were utilized during procedure). Indications:   Stroke  History:       Patient has no prior history of Echocardiogram examinations.                Stroke.  Sonographer:   Norleen Amour Referring      [phone] PROSPER M AMPONSAH Phys: IMPRESSIONS  1. Left ventricular ejection fraction, by estimation, is 45 to 50%. The left ventricle has mildly decreased function. The  left ventricle demonstrates regional wall motion abnormalities (see scoring diagram/findings for description). Left ventricular diastolic parameters are indeterminate.  2. Right ventricular systolic function is normal. The right ventricular size is normal. Tricuspid regurgitation signal is inadequate for assessing PA pressure.  3. The mitral valve is grossly normal. Trivial mitral valve regurgitation. No evidence  of mitral stenosis.  4. The aortic valve is grossly normal. Aortic valve regurgitation is not visualized. No aortic stenosis is present.  5. The inferior vena cava is normal in size with greater than 50% respiratory variability, suggesting right atrial pressure of 3 mmHg.  6. Agitated saline contrast bubble study was negative, with no evidence of any interatrial shunt. FINDINGS  Left Ventricle: Left ventricular ejection fraction, by estimation, is 45 to 50%. The left ventricle has mildly decreased function. The left ventricle demonstrates regional wall motion abnormalities. The left ventricular internal cavity size was normal in size. There is no left ventricular hypertrophy. Left ventricular diastolic parameters are indeterminate.  LV Wall Scoring: The posterior wall is hypokinetic. Right Ventricle: The right ventricular size is normal. No increase in right ventricular wall thickness. Right ventricular systolic function is normal. Tricuspid regurgitation signal is inadequate for assessing PA pressure. Left Atrium: Left atrial size was normal in size. Right Atrium: Right atrial size was normal in size. Pericardium: There is no evidence of pericardial effusion. Mitral Valve: The mitral valve is grossly normal. Trivial mitral valve regurgitation. No evidence of mitral valve stenosis. Tricuspid Valve: The tricuspid valve is grossly normal. Tricuspid valve regurgitation is trivial. No evidence of tricuspid stenosis. Aortic Valve: The aortic valve is grossly normal. Aortic valve regurgitation is not visualized.  No aortic stenosis is present. Pulmonic Valve: The pulmonic valve was not well visualized. Pulmonic valve regurgitation is not visualized. Aorta: The aortic root and ascending aorta are structurally normal, with no evidence of dilitation. Venous: The inferior vena cava is normal in size with greater than 50% respiratory variability, suggesting right atrial pressure of 3 mmHg. IAS/Shunts: The atrial septum is grossly normal. Agitated saline contrast was given intravenously to evaluate for intracardiac shunting. Agitated saline contrast bubble study was negative, with no evidence of any interatrial shunt.  LEFT VENTRICLE PLAX 2D LVIDd:         5.80 cm      Diastology LVIDs:         4.40 cm      LV e' medial:    5.59 cm/s LV PW:         1.10 cm      LV E/e' medial:  17.4 LV IVS:        1.10 cm      LV e' lateral:   10.10 cm/s LVOT diam:     1.89 cm      LV E/e' lateral: 9.7 LV SV:         46 LV SV Index:   24 LVOT Area:     2.81 cm  LV Volumes (MOD) LV vol d, MOD A2C: 114.0 ml LV vol d, MOD A4C: 103.0 ml LV vol s, MOD A2C: 45.2 ml LV vol s, MOD A4C: 67.3 ml LV SV MOD A2C:     68.8 ml LV SV MOD A4C:     103.0 ml LV SV MOD BP:      53.2 ml RIGHT VENTRICLE            IVC RV Basal diam:  3.00 cm    IVC diam: 1.47 cm RV S prime:     8.84 cm/s TAPSE (M-mode): 1.5 cm LEFT ATRIUM             Index        RIGHT ATRIUM           Index LA diam:        4.34 cm 2.23  cm/m   RA Area:     12.80 cm LA Vol (A2C):   54.9 ml 28.18 ml/m  RA Volume:   31.70 ml  16.27 ml/m LA Vol (A4C):   37.3 ml 19.15 ml/m LA Biplane Vol: 46.6 ml 23.92 ml/m  AORTIC VALVE             PULMONIC VALVE LVOT Vmax:   83.00 cm/s  PV Vmax:       0.95 m/s LVOT Vmean:  56.000 cm/s PV Peak grad:  3.6 mmHg LVOT VTI:    0.164 m  AORTA Ao Root diam: 2.91 cm Ao Asc diam:  3.12 cm MITRAL VALVE MV Area (PHT): 6.90 cm    SHUNTS MV Decel Time: 110 msec    Systemic VTI:  0.16 m MV E velocity: 97.50 cm/s  Systemic Diam: 1.89 cm MV A velocity: 45.60 cm/s MV E/A ratio:   2.14 Darryle Decent MD Electronically signed by Darryle Decent MD Signature Date/Time: 01/19/2024/10:53:23 AM    Final    CT ANGIO HEAD NECK W WO CM Result Date: 01/18/2024 EXAM: CTA HEAD AND NECK WITH AND WITHOUT 01/18/2024 09:01:07 PM TECHNIQUE: CTA of the head and neck was performed with and without the administration of intravenous contrast. Multiplanar 2D and/or 3D reformatted images are provided for review. Automated exposure control, iterative reconstruction, and/or weight based adjustment of the mA/kV was utilized to reduce the radiation dose to as low as reasonably achievable. COMPARISON: None available CLINICAL HISTORY: Neuro deficit, acute, stroke suspected FINDINGS: AORTIC ARCH AND ARCH VESSELS: No dissection or arterial injury. No significant stenosis of the brachiocephalic or subclavian arteries. 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 Right carotid is patent without significant stenosis. CERVICAL VERTEBRAL ARTERIES: Moderate left vertebral artery origin stenosis. Otherwise, no significant stenosis. LUNGS AND MEDIASTINUM: Unremarkable. SOFT TISSUES: No acute abnormality. BONES: No acute abnormality. CTA HEAD: ANTERIOR CIRCULATION: Nonopacification of the majority of the left ICA. Reconstitution of the left paraclinoid ICA and proximal left M1 MCA with abrupt occlusion of the left mid M1 MCA. Poor distal MCA opacification/collaterals. Right MCA is patent. No significant stenosis of the anterior cerebral arteries. No significant stenosis of the middle cerebral arteries. No aneurysm. POSTERIOR CIRCULATION: No significant stenosis of the posterior cerebral arteries. No significant stenosis of the basilar artery. No significant stenosis of the vertebral arteries. No aneurysm. OTHER: No dural venous sinus thrombosis on this non-dedicated study. Findings discussed Dr. Arora via telephone at 9:20 PM. IMPRESSION: 1. 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 2.  Reconstitution of the left paraclinoid ICA and proximal left M1 MCA with abrupt occlusion of the left mid M1 MCA. Poor distal left MCA opacification/collaterals. Electronically signed by: Gilmore Molt MD 01/18/2024 09:32 PM EST RP Workstation: [id number]   MR BRAIN WO CONTRAST Result Date: 01/18/2024 EXAM: MRI Brain Without Contrast 01/18/2024 08:51:00 PM TECHNIQUE: Multiplanar multisequence MRI of the head/brain was performed without the administration of intravenous contrast. COMPARISON: None available. CLINICAL HISTORY: Mental status change, unknown cause; Neuro deficit, acute, stroke suspected FINDINGS: Motion limited study. BRAIN AND VENTRICLES: Acute left MCA territory infarcts including infarcts in the left basal ganglia, left insula, overlying left frontal and parietal lobes and to a lesser extent, the anterior left temporal lobe. Edema without midline shift. No intracranial hemorrhage. No mass. No hydrocephalus. Possible abnormal left ICA flow void. Moderate T2 hyperintensities in the white matter, compatible with chronic microvascular ischemic change. ORBITS: No acute abnormality. SINUSES AND MASTOIDS: No acute abnormality. BONES  AND SOFT TISSUES: Normal marrow signal. IMPRESSION: 1. Acute left MCA territory infarcts.  No midline shift. 2. Possible abnormal left ICA flow void. Please see forthcoming ordered CTA head/neck. Electronically signed by: Gilmore Molt MD 01/18/2024 09:11 PM EST RP Workstation: [id number]   DG Chest Port 1 View Result Date: 01/18/2024 CLINICAL DATA:  AMS EXAM: PORTABLE CHEST - 1 VIEW COMPARISON:  01/07/2022 FINDINGS: Biapical pleural thickening. No focal airspace consolidation, pleural effusion, or pneumothorax. No cardiomegaly. Tortuous aorta with aortic atherosclerosis. No acute fracture or destructive lesions. Multilevel thoracic osteophytosis. Vascular calcification along the left neck. IMPRESSION: No acute cardiopulmonary abnormality. Electronically Signed   By:  Rogelia Myers M.D.   On: 01/18/2024 19:41      Vernal Alstrom, MD  [facility] 01/20/2024  If 7PM-7AM, please contact night-coverage

## 2024-01-20 NOTE — Progress Notes (Signed)
 STROKE TEAM PROGRESS NOTE   INTERIM HISTORY/SUBJECTIVE No family at the bedside.  Cortrak team is at the bedside to place cortrak, apparently he did not pass swallow and needs TF.    CBC    Component Value Date/Time   WBC 8.9 01/18/2024 1922   RBC 4.95 01/18/2024 1922   HGB 15.6 01/18/2024 1948   HCT 46.0 01/18/2024 1948   PLT 240 01/18/2024 1922   MCV 96.4 01/18/2024 1922   MCH 32.7 01/18/2024 1922   MCHC 34.0 01/18/2024 1922   RDW 13.4 01/18/2024 1922    BMET    Component Value Date/Time   NA 137 01/20/2024 0108   K 3.7 01/20/2024 0108   CL 104 01/20/2024 0108   CO2 19 (L) 01/20/2024 0108   GLUCOSE 88 01/20/2024 0108   BUN 13 01/20/2024 0108   CREATININE 1.12 01/20/2024 0108   CALCIUM  8.7 (L) 01/20/2024 0108   GFRNONAA >60 01/20/2024 0108    IMAGING past 24 hours DG Swallowing Func-Speech Pathology Result Date: 01/20/2024 Table formatting from the original result was not included. Modified Barium Swallow Study Patient Details Name: Edward Phelps MRN: [id number] Date of Birth: [birth year] Today's Date: 01/20/2024 HPI/PMH: HPI: Patient is a 63 y.o. male who presented to the hospital via EMS on 01/18/24 after his boss from work  went to check on him and found him sitting on the floor with right facial droop and unable to talk. In ED, CXR did not show any active disease, EKG showed sinus tachycardia, MRI brain showed acute left MCA territory infarcts but no midline shift. 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 He has been NPO since admitted after failing [facility] swallow screen with RN. BSE completed by SLP on 11/25 and recommending NPO pending completion of MBS.PMH: HTN, heavy tobacco use, heavy ETOH use, chronic low back pain, DDD, medication non-compliance. Clinical Impression: Clinical Impression: Patient presents with a moderate oropharyngeal dysphagia as per this MBS. Sensed penetration to the vocal cords (PAS 5) and suspected sensed aspiration (PAS 7) occured  one time with thin liquids. Penetration that cleared laryngeal vestibule (PAS 2) occured with thin liquids. Oral phase was significantly impaired with reduced and slow lingual movement leading to poor bolus cohesion, delays of anterior to posterior bolus transit, oral holding and oral residuals s/p initial swallows and anterior spillage of primarily liquid boluses on right side of oral cavity. Swallow initiation delayed at level of pyriform sinus for thin and nectar thick liquids and delayed at vallecular sinus with honey thick liquids, puree and solids. DIGEST Swallow Severity Rating*  Safety: 1  Efficiency: 2  Overall Pharyngeal Swallow Severity: 2 1: mild; 2: moderate; 3: severe; 4: profound *The Dynamic Imaging Grade of Swallowing Toxicity is standardized for the head and neck cancer population, however, demonstrates promising clinical applications across populations to standardize the clinical rating of pharyngeal swallow safety and severity. Factors that may increase risk of adverse event in presence of aspiration Noe & Lianne 2021): Factors that may increase risk of adverse event in presence of aspiration Noe & Lianne 2021): Weak cough; Frail or deconditioned; Limited mobility; Reduced cognitive function Recommendations/Plan: Swallowing Evaluation Recommendations Swallowing Evaluation Recommendations Recommendations: NPO; NPO except meds; Ice chips PRN after oral care; Free water protocol after oral care Liquid Administration via: Cup; Spoon Supervision: Full supervision/cueing for swallowing strategies; Full assist for feeding Swallowing strategies  : Slow rate; Minimize environmental distractions; Small bites/sips Postural changes: Position pt fully upright for meals Oral care recommendations:  Oral care BID (2x/day); Oral care before ice chips/water; Oral care before PO; Staff/trained caregiver to provide oral care Treatment Plan Treatment Plan Treatment recommendations: Therapy as outlined in  treatment plan below Follow-up recommendations: Acute inpatient rehab (3 hours/day) Functional status assessment: Patient has had a recent decline in their functional status and demonstrates the ability to make significant improvements in function in a reasonable and predictable amount of time. Treatment frequency: Min 3x/week Treatment duration: 2 weeks Interventions: Aspiration precaution training; Diet toleration management by SLP; Compensatory techniques; Patient/family education; Trials of upgraded texture/liquids Recommendations Recommendations for follow up therapy are one component of a multi-disciplinary discharge planning process, led by the attending physician.  Recommendations may be updated based on patient status, additional functional criteria and insurance authorization. Assessment: Orofacial Exam: Orofacial Exam Oral Cavity - Dentition: Dentures, top; Dentures, bottom; Missing dentition Oral Motor/Sensory Function: Suspected cranial nerve impairment CN V - Trigeminal: Right motor impairment CN VII - Facial: Right motor impairment Anatomy: Anatomy: WFL Boluses Administered: Boluses Administered Boluses Administered: Thin liquids (Level 0); Mildly thick liquids (Level 2, nectar thick); Moderately thick liquids (Level 3, honey thick); Puree; Solid  Oral Impairment Domain: Oral Impairment Domain Lip Closure: Escape from interlabial space or lateral juncture, no extension beyond vermillion border Tongue control during bolus hold: Not tested Bolus transport/lingual motion: Delayed initiation of tongue motion (oral holding); Slow tongue motion Oral residue: Residue collection on oral structures Location of oral residue : Floor of mouth; Palate; Tongue Initiation of pharyngeal swallow : Pyriform sinuses  Pharyngeal Impairment Domain: Pharyngeal Impairment Domain Soft palate elevation: No bolus between soft palate (SP)/pharyngeal wall (PW) Laryngeal elevation: Complete superior movement of thyroid cartilage  with complete approximation of arytenoids to epiglottic petiole Anterior hyoid excursion: Partial anterior movement Epiglottic movement: Complete inversion Laryngeal vestibule closure: Incomplete, narrow column air/contrast in laryngeal vestibule Pharyngeal stripping wave : Present - complete Pharyngeal contraction (A/P view only): N/A Pharyngoesophageal segment opening: Complete distension and complete duration, no obstruction of flow Tongue base retraction: Narrow column of contrast or air between tongue base and PPW Pharyngeal residue: Collection of residue within or on pharyngeal structures Location of pharyngeal residue: Valleculae; Pyriform sinuses; Tongue base; Pharyngeal wall; Diffuse (>3 areas)  Esophageal Impairment Domain: Esophageal Impairment Domain Esophageal clearance upright position: Complete clearance, esophageal coating Pill: No data recorded Penetration/Aspiration Scale Score: Penetration/Aspiration Scale Score 1.  Material does not enter airway: Moderately thick liquids (Level 3, honey thick); Puree; Solid; Mildly thick liquids (Level 2, nectar thick) 2.  Material enters airway, remains ABOVE vocal cords then ejected out: Thin liquids (Level 0) 5.  Material enters airway, CONTACTS cords and not ejected out: Thin liquids (Level 0) 7.  Material enters airway, passes BELOW cords and not ejected out despite cough attempt by patient: Thin liquids (Level 0) Compensatory Strategies: Compensatory Strategies Compensatory strategies: Yes Straw: Ineffective Ineffective Straw: Thin liquid (Level 0)   General Information: No data recorded Diet Prior to this Study: NPO   Temperature : Normal   Respiratory Status: WFL   Supplemental O2: None (Room air)   History of Recent Intubation: No  Behavior/Cognition: Alert; Cooperative; Requires cueing; Doesn't follow directions Self-Feeding Abilities: Able to self-feed; Needs set-up for self-feeding Baseline vocal quality/speech: Other (comment) (limited vocalizations  heard) Volitional Cough: Unable to elicit Volitional Swallow: Unable to elicit Exam Limitations: Poor positioning Goal Planning: Prognosis for improved oropharyngeal function: Good Barriers to Reach Goals: Cognitive deficits; Severity of deficits No data recorded Patient/Family Stated Goal: patient unable to state Consulted  and agree with results and recommendations: Pt unable/family or caregiver not available Pain: Pain Assessment Pain Assessment: Faces Faces Pain Scale: 0 Facial Expression: 0 Body Movements: 0 Muscle Tension: 0 Compliance with ventilator (intubated pts.): N/A Vocalization (extubated pts.): N/A CPOT Total: 0 Pain Location: Patient would occassionally grimace at times. Pain Descriptors / Indicators: Grimacing Pain Intervention(s): Monitored during session; Repositioned End of Session: Start Time:SLP Start Time (ACUTE ONLY): 9077 Stop Time: SLP Stop Time (ACUTE ONLY): 0942 Time Calculation:SLP Time Calculation (min) (ACUTE ONLY): 20 min Charges: SLP Evaluations $ SLP Speech Visit: 1 Visit SLP Evaluations $BSS Swallow: 1 Procedure $MBS Swallow: 1 Procedure $ SLP EVAL LANGUAGE/SOUND PRODUCTION: 1 Procedure SLP visit diagnosis: SLP Visit Diagnosis: Dysphagia, oropharyngeal phase (R13.12) Past Medical History: Past Medical History: Diagnosis Date  Abnormal stress test   Low risk but abnormal stress test  DDD (degenerative disc disease)   Hypertension   Tobacco abuse  Past Surgical History: Past Surgical History: Procedure Laterality Date  LUMBAR FUSION   Norleen IVAR Blase, MA, CCC-SLP Speech Therapy    Vitals:   01/20/24 0300 01/20/24 0500 01/20/24 0504 01/20/24 1137  BP:  (!) 165/70 (!) 157/80 (!) 175/87  Pulse:  80  73  Resp: (!) 2 17  18   Temp:  98 F (36.7 C)  97.8 F (36.6 C)  TempSrc:  Oral  Oral  SpO2:  98%  97%  Weight:      Height:         PHYSICAL EXAM General: Lethargic, well-developed patient in no acute distress Psych:  Mood and affect appropriate for situation CV: Regular  rate and rhythm on monitor Respiratory:  Regular, unlabored respirations on room air GI: Abdomen soft and nontender   NEURO:  Eyes open, global aphasia and not able to answer questions. Not following simple commands or pantomime except closed eyes on command. Not able to name or repeat. Eyes left gaze preference but cross midline, R gaze incomplete, R visual neglect. R facial droop. Tongue protrusion not cooperative. LUE and LLE seems to be 5/5, RUE and RLE mild withdraw to pain. Sensation, coordination and gait not tested    ASSESSMENT/PLAN  Edward Phelps is a 63 y.o. male with history of  hypertension, tobacco abuse, chronic low back pain due to old back injury at work, presenting for evaluation of right-sided weakness and inability to talk as well as right-sided facial drooping which was noticed by family today.  Last known well when he came back from work Friday and has since not been spoken to.   NIH on Admission 13  Stroke:  left MCA territory infarcts, etiology: Likely large vessel disease MRI  Acute left MCA territory infarcts.  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 Reconstitution of the left paraclinoid ICA and proximal left M1 MCA with abrupt occlusion of the left mid M1 MCA. Poor distal left MCA opacification/collaterals. 2D Echo EF 45 to 50% LDL 133 HgbA1c 5.6 UDS + THC VTE prophylaxis -Lovenox  aspirin  81 mg daily prior to admission, now on aspirin  300 mg suppository daily given on po access Therapy recommendations:  CIR vs SNF Disposition: Pending  Hypertension Home meds: Amlodipine  5 mg, losartan  100 mg (not sure about compliance) Stable on the high end Avoid low BP Gradually normalize BP in 3-5 days Long term BP goal normotensive  Hyperlipidemia Home meds: None LDL 133, goal < 70 Add atorvastatin  80 once po access Continue statin at discharge  Tobacco Abuse ETOH abuse  Patient smokes a lot per son, and has many beers during the day  Smoking  cessation and alcohol limitation education will be provided CIWA protocol Thiamine  and folate and multivitamin daily DT and seizure precaution  THC Abuse Patient uses THC intermittently per family UDS + THC TOC consult for cessation placed  Dysphagia Patient has post-stroke dysphagia, SLP consulted Currently n.p.o. Pending cortrak placement Will start TF after cortrak  Other Stroke Risk Factors Congestive heart failure  Other Active Problems AKI, creatinine 1.4-1.26--1.12, on IVF  Hospital day # 2    Ary Cummins, MD PhD Stroke Neurology 01/20/2024 1:39 PM       To contact Stroke Continuity provider, please refer to [email or web address]. After hours, contact General Neurology

## 2024-01-20 NOTE — Procedures (Signed)
 Modified Barium Swallow Study  Patient Details  Name: RAOUL CIANO MRN: [id number] Date of Birth: 09-08-1960  Today's Date: 01/20/2024  Modified Barium Swallow completed.  Full report located under Chart Review in the Imaging Section.  History of Present Illness Patient is a 63 y.o. male who presented to the hospital via EMS on 01/18/24 after his boss from work  went to check on him and found him sitting on the floor with right facial droop and unable to talk. In ED, CXR did not show any active disease, EKG showed sinus tachycardia, MRI brain showed acute left MCA territory infarcts but no midline shift. 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 He has been NPO since admitted after failing [facility] swallow screen with RN. BSE completed by SLP on 11/25 and recommending NPO pending completion of MBS.PMH: HTN, heavy tobacco use, heavy ETOH use, chronic low back pain, DDD, medication non-compliance.   Clinical Impression Patient presents with a moderate oropharyngeal dysphagia as per this MBS. Sensed penetration to the vocal cords (PAS 5) and suspected sensed aspiration (PAS 7) occured one time with thin liquids. Penetration that cleared laryngeal vestibule (PAS 2) occured with thin liquids. Oral phase was significantly impaired with reduced and slow lingual movement leading to poor bolus cohesion, delays of anterior to posterior bolus transit, oral holding and oral residuals s/p initial swallows and anterior spillage of primarily liquid boluses on right side of oral cavity. Swallow initiation delayed at level of pyriform sinus for thin and nectar thick liquids and delayed at vallecular sinus with honey thick liquids, puree and solids. Factors that may increase risk of adverse event in presence of aspiration Noe & Lianne 2021): Weak cough;Frail or deconditioned;Limited mobility;Reduced cognitive function  DIGEST Swallow Severity Rating*  Safety: 1  Efficiency: 2  Overall  Pharyngeal Swallow Severity: 2 1: mild; 2: moderate; 3: severe; 4: profound  *The Dynamic Imaging Grade of Swallowing Toxicity is standardized for the head and neck cancer population, however, demonstrates promising clinical applications across populations to standardize the clinical rating of pharyngeal swallow safety and severity.  Swallow Evaluation Recommendations Recommendations: NPO;NPO except meds;Ice chips PRN after oral care;Free water protocol after oral care Liquid Administration via: Cup;Spoon Supervision: Full supervision/cueing for swallowing strategies;Full assist for feeding Swallowing strategies  : Slow rate;Minimize environmental distractions;Small bites/sips Postural changes: Position pt fully upright for meals Oral care recommendations: Oral care BID (2x/day);Oral care before ice chips/water;Oral care before PO;Staff/trained caregiver to provide oral care    Norleen IVAR Blase, MA, CCC-SLP Speech Therapy

## 2024-01-20 NOTE — Hospital Course (Signed)
 63 y.o. male with medical history significant for hypertension, tobacco use disorder, chronic low back pain, DDD, and medication noncompliance presented to hospital with right-sided weakness facial droop and aphasia.  MRI brain shows acute left MCA territory infarcts but no midline shift. 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  Patient was outside the window for thrombolytic treatment and has been started on aspirin .  Neurology on board.  2D echocardiogram with LV ejection fraction of 45 to 50% with regional wall motion abnormality and indeterminate diastolic dysfunction.  No evidence of interatrial shunt.  Hemoglobin A1c of 5.6, lipid panel was notable for LDL of 133.  Patient has been seen by physical therapy and recommended CIR at this time.  Patient has been seen by speech therapy and recommend n.p.o. at this time.  Plan for modified barium swallow.  Continue statins 80 mg daily.   Essential hypertension Blood pressure was uncontrolled on presentation secondary to noncompliance to her hypertension.  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  At this time creatinine has improved to 1.1.     History of degenerative disc disease Continue Tylenol  and Lidoderm  patch.   Tobacco use disorder Quitting smoking reinforced

## 2024-01-20 NOTE — Progress Notes (Signed)
  Inpatient Rehabilitation Admissions Coordinator   Met with patient working with therapy at bedside for rehab assessment. I then contacted his son, [name], by phone. Patient was living at a hotel prior to admit and working as a education administrator. Family have moved him out of that hotel . He will not reach a Mod I Level at CIR to return home without 24/7 physical assist. Family unable to provide 24/7 care at this time. He is uninsured and [name] is requesting assistance in applying for disability and Medicaid. I discussed the barriers to SNF placement as he is uninsured. I will notify TOC of need for SNF and we will sign off at this time. Please call me with any questions.   Heron Leavell, RN, MSN Rehab Admissions Coordinator [phone]

## 2024-01-21 LAB — CBC
HCT: 47.6 % (ref 39.0–52.0)
Hemoglobin: 16.7 g/dL (ref 13.0–17.0)
MCH: 32.9 pg (ref 26.0–34.0)
MCHC: 35.1 g/dL (ref 30.0–36.0)
MCV: 93.7 fL (ref 80.0–100.0)
Platelets: 242 K/uL (ref 150–400)
RBC: 5.08 MIL/uL (ref 4.22–5.81)
RDW: 13.3 % (ref 11.5–15.5)
WBC: 8.4 K/uL (ref 4.0–10.5)
nRBC: 0 % (ref 0.0–0.2)

## 2024-01-21 LAB — GLUCOSE, CAPILLARY
Glucose-Capillary: 144 mg/dL — ABNORMAL HIGH (ref 70–99)
Glucose-Capillary: 149 mg/dL — ABNORMAL HIGH (ref 70–99)
Glucose-Capillary: 151 mg/dL — ABNORMAL HIGH (ref 70–99)
Glucose-Capillary: 155 mg/dL — ABNORMAL HIGH (ref 70–99)
Glucose-Capillary: 156 mg/dL — ABNORMAL HIGH (ref 70–99)
Glucose-Capillary: 162 mg/dL — ABNORMAL HIGH (ref 70–99)
Glucose-Capillary: 170 mg/dL — ABNORMAL HIGH (ref 70–99)

## 2024-01-21 LAB — BASIC METABOLIC PANEL WITH GFR
Anion gap: 11 (ref 5–15)
BUN: 18 mg/dL (ref 8–23)
CO2: 20 mmol/L — ABNORMAL LOW (ref 22–32)
Calcium: 8.6 mg/dL — ABNORMAL LOW (ref 8.9–10.3)
Chloride: 105 mmol/L (ref 98–111)
Creatinine, Ser: 1.04 mg/dL (ref 0.61–1.24)
GFR, Estimated: >60 mL/min (ref >60)
Glucose, Bld: 152 mg/dL — ABNORMAL HIGH (ref 70–99)
Potassium: 3.3 mmol/L — ABNORMAL LOW (ref 3.5–5.1)
Sodium: 136 mmol/L (ref 135–145)

## 2024-01-21 LAB — MAGNESIUM: Magnesium: 2.2 mg/dL (ref 1.7–2.4)

## 2024-01-21 LAB — PHOSPHORUS: Phosphorus: 2.7 mg/dL (ref 2.5–4.6)

## 2024-01-21 MED ORDER — POTASSIUM CHLORIDE 20 MEQ PO PACK
40.0000 meq | PACK | Freq: Once | ORAL | Status: AC
  Administered 2024-01-21: 40 meq via ORAL
  Filled 2024-01-21: qty 2

## 2024-01-21 MED ORDER — GUAIFENESIN-DM 100-10 MG/5ML PO SYRP
5.0000 mL | ORAL_SOLUTION | ORAL | Status: DC | PRN
  Administered 2024-01-22 – 2024-01-23 (×3): 5 mL via ORAL
  Filled 2024-01-21 (×3): qty 5

## 2024-01-21 MED ORDER — ALBUTEROL SULFATE (2.5 MG/3ML) 0.083% IN NEBU
2.5000 mg | INHALATION_SOLUTION | Freq: Four times a day (QID) | RESPIRATORY_TRACT | Status: DC | PRN
  Administered 2024-01-21 – 2024-01-24 (×3): 2.5 mg via RESPIRATORY_TRACT
  Filled 2024-01-21 (×4): qty 3

## 2024-01-21 MED ORDER — POTASSIUM CHLORIDE 20 MEQ PO PACK: 20.0000 meq | PACK | Freq: Once | ORAL | Status: DC

## 2024-01-21 NOTE — Plan of Care (Signed)
 progressing

## 2024-01-21 NOTE — Progress Notes (Signed)
 PROGRESS NOTE  TESEAN STUMP FMW:[id number] DOB: 08/19/1960 DOA: 01/18/2024 PCP: Medicine, [facility]   LOS: 3 days   Brief narrative:  63 y.o. male with medical history significant for hypertension, tobacco use disorder, chronic low back pain, DDD, and medication noncompliance presented to hospital with right-sided weakness facial droop and aphasia.  MRI brain showed acute left MCA territory infarcts but no midline shift. 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 Neurology was consulted and patient presented to hospital for further evaluation and treatment.   Assessment/Plan: Principal Problem:   Acute ischemic left MCA stroke (HCC) Active Problems:   Malnutrition of moderate degree  Acute left MCA infarct MRI with left MCA infarct. 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  Patient was outside the window for thrombolytic treatment and has been started on aspirin .  Neurology on board.  2D echocardiogram with LV ejection fraction of 45 to 50% with regional wall motion abnormality and indeterminate diastolic dysfunction.  No evidence of interatrial shunt.  Hemoglobin A1c of 5.6, lipid panel was notable for LDL of 133.  Patient has been seen by physical therapy and recommended CIR at this time.  Patient has been seen by speech therapy and recommend n.p.o. at this time.  Plan for modified barium swallow.  Continue statins 80 mg daily.  post cortrak tube tube placement and has been started on tube feeding.  Will discontinue D5 normal saline.  Electrolyte monitoring while on tube feeding.  Cough congestion.  Likely secondary to severe dysphagia and secretions.  History of smoking and possible COPD.  Will add albuterol  nebulizer, suction throat as needed.  Aspiration precautions.  Nutrition.  Status post cortrak tube tube placement and tube feeding.  Dietitian on board.  Monitor electrolytes while on tube feeding.   Essential hypertension Blood  pressure was uncontrolled on presentation secondary to noncompliance to medication.  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  At this time creatinine has improved to 1.0.     History of degenerative disc disease Continue Tylenol  and Lidoderm  patch.   Tobacco use disorder Continue nicotine  patch  DVT prophylaxis: enoxaparin  (LOVENOX ) injection 40 mg Start: 01/19/24 1000   Disposition: Likely to CIR as per PT evaluation  Status is: Inpatient Remains inpatient appropriate because: Pending clinical improvement, need for rehabilitation, deseeding    Code Status:     Code Status: Full Code  Family Communication: None at bedside  Consultants: Neurology  Procedures: None  Anti-infectives:  None  Anti-infectives (From admission, onward)    None       Subjective: Today, patient was seen and examined at bedside.  Patient with expressive aphasia and distinct speech.  Nursing staff reported some upper respiratory sounds and mild coughing.  Objective: Vitals:   01/21/24 0333 01/21/24 0802  BP: (!) 181/79 (!) 159/95  Pulse: 90 85  Resp: 19 16  Temp: 98.9 F (37.2 C) 99.2 F (37.3 C)  SpO2: 96% 98%    Intake/Output Summary (Last 24 hours) at 01/21/2024 1056 Last data filed at 01/21/2024 0500 Gross per 24 hour  Intake 1682.57 ml  Output 475 ml  Net 1207.57 ml   Filed Weights   01/19/24 1608 01/21/24 0100  Weight: 77.1 kg 69.7 kg   Body mass index is 22.05 kg/m.   Physical Exam: GENERAL: Patient is alert awake and follows commands, expressive aphasia, not in obvious distress.  Oral  cavity with some secretion. HENT: No scleral pallor or icterus. Pupils equally reactive to light. Oral mucosa is moist NECK: is supple, no gross swelling noted. CHEST: Coarse breath sounds noted, diminished breath sounds bilaterally. CVS: S1 and S2 heard, no murmur. Regular rate and rhythm.  ABDOMEN: Soft,  non-tender, bowel sounds are present. EXTREMITIES: No edema. CNS right facial weakness. right-sided flaccid weakness, expressive aphasia SKIN: warm and dry without rashes.  Data Review: I have personally reviewed the following laboratory data and studies,  CBC: Recent Labs  Lab 01/18/24 1922 01/18/24 1948 01/21/24 0113  WBC 8.9  --  8.4  HGB 16.2 15.6 16.7  HCT 47.7 46.0 47.6  MCV 96.4  --  93.7  PLT 240  --  242   Basic Metabolic Panel: Recent Labs  Lab 01/18/24 1922 01/18/24 1948 01/19/24 0225 01/19/24 2051 01/20/24 0108 01/20/24 1416 01/21/24 0113  NA 135 136 135 136 137  --  136  K 4.1 4.2 4.1 3.9 3.7  --  3.3*  CL 100 102 102 101 104  --  105  CO2 21*  --  18* 17* 19*  --  20*  GLUCOSE 94 98 79 92 88  --  152*  BUN 17 19 17 11 13   --  18  CREATININE 1.48* 1.40* 1.26* 1.18 1.12  --  1.04  CALCIUM  8.6*  --  8.7* 8.5* 8.7*  --  8.6*  MG  --   --   --  2.1  --  2.2 2.2  PHOS  --   --   --   --   --  2.9 2.7   Liver Function Tests: Recent Labs  Lab 01/19/24 0225  AST 26  ALT 21  ALKPHOS 63  BILITOT 3.3*  PROT 6.6  ALBUMIN 3.5   No results for input(s): LIPASE, AMYLASE in the last 168 hours. No results for input(s): AMMONIA in the last 168 hours. Cardiac Enzymes: Recent Labs  Lab 01/18/24 1922  CKTOTAL 309   BNP (last 3 results) No results for input(s): BNP in the last 8760 hours.  ProBNP (last 3 results) No results for input(s): PROBNP in the last 8760 hours.  CBG: Recent Labs  Lab 01/20/24 1630 01/20/24 2001 01/21/24 0028 01/21/24 0418 01/21/24 0803  GLUCAP 121* 150* 151* 155* 144*   No results found for this or any previous visit (from the past 240 hours).   Studies: DG Swallowing Func-Speech Pathology Result Date: 01/20/2024 Table formatting from the original result was not included. Modified Barium Swallow Study Patient Details Name: IRBY FAILS MRN: [id number] Date of Birth: 07/30/1960 Today's Date: 01/20/2024 HPI/PMH:  HPI: Patient is a 63 y.o. male who presented to the hospital via EMS on 01/18/24 after his boss from work  went to check on him and found him sitting on the floor with right facial droop and unable to talk. In ED, CXR did not show any active disease, EKG showed sinus tachycardia, MRI brain showed acute left MCA territory infarcts but no midline shift. 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 He has been NPO since admitted after failing [facility] swallow screen with RN. BSE completed by SLP on 11/25 and recommending NPO pending completion of MBS.PMH: HTN, heavy tobacco use, heavy ETOH use, chronic low back pain, DDD, medication non-compliance. Clinical Impression: Clinical Impression: Patient presents with a moderate oropharyngeal dysphagia as per this MBS. Sensed penetration to the vocal cords (PAS 5) and suspected sensed aspiration (PAS 7) occured  one time with thin liquids. Penetration that cleared laryngeal vestibule (PAS 2) occured with thin liquids. Oral phase was significantly impaired with reduced and slow lingual movement leading to poor bolus cohesion, delays of anterior to posterior bolus transit, oral holding and oral residuals s/p initial swallows and anterior spillage of primarily liquid boluses on right side of oral cavity. Swallow initiation delayed at level of pyriform sinus for thin and nectar thick liquids and delayed at vallecular sinus with honey thick liquids, puree and solids. DIGEST Swallow Severity Rating*  Safety: 1  Efficiency: 2  Overall Pharyngeal Swallow Severity: 2 1: mild; 2: moderate; 3: severe; 4: profound *The Dynamic Imaging Grade of Swallowing Toxicity is standardized for the head and neck cancer population, however, demonstrates promising clinical applications across populations to standardize the clinical rating of pharyngeal swallow safety and severity. Factors that may increase risk of adverse event in presence of aspiration Noe & Lianne 2021): Factors that  may increase risk of adverse event in presence of aspiration Noe & Lianne 2021): Weak cough; Frail or deconditioned; Limited mobility; Reduced cognitive function Recommendations/Plan: Swallowing Evaluation Recommendations Swallowing Evaluation Recommendations Recommendations: NPO; NPO except meds; Ice chips PRN after oral care; Free water protocol after oral care Liquid Administration via: Cup; Spoon Supervision: Full supervision/cueing for swallowing strategies; Full assist for feeding Swallowing strategies  : Slow rate; Minimize environmental distractions; Small bites/sips Postural changes: Position pt fully upright for meals Oral care recommendations: Oral care BID (2x/day); Oral care before ice chips/water; Oral care before PO; Staff/trained caregiver to provide oral care Treatment Plan Treatment Plan Treatment recommendations: Therapy as outlined in treatment plan below Follow-up recommendations: Acute inpatient rehab (3 hours/day) Functional status assessment: Patient has had a recent decline in their functional status and demonstrates the ability to make significant improvements in function in a reasonable and predictable amount of time. Treatment frequency: Min 3x/week Treatment duration: 2 weeks Interventions: Aspiration precaution training; Diet toleration management by SLP; Compensatory techniques; Patient/family education; Trials of upgraded texture/liquids Recommendations Recommendations for follow up therapy are one component of a multi-disciplinary discharge planning process, led by the attending physician.  Recommendations may be updated based on patient status, additional functional criteria and insurance authorization. Assessment: Orofacial Exam: Orofacial Exam Oral Cavity - Dentition: Dentures, top; Dentures, bottom; Missing dentition Oral Motor/Sensory Function: Suspected cranial nerve impairment CN V - Trigeminal: Right motor impairment CN VII - Facial: Right motor impairment Anatomy:  Anatomy: WFL Boluses Administered: Boluses Administered Boluses Administered: Thin liquids (Level 0); Mildly thick liquids (Level 2, nectar thick); Moderately thick liquids (Level 3, honey thick); Puree; Solid  Oral Impairment Domain: Oral Impairment Domain Lip Closure: Escape from interlabial space or lateral juncture, no extension beyond vermillion border Tongue control during bolus hold: Not tested Bolus transport/lingual motion: Delayed initiation of tongue motion (oral holding); Slow tongue motion Oral residue: Residue collection on oral structures Location of oral residue : Floor of mouth; Palate; Tongue Initiation of pharyngeal swallow : Pyriform sinuses  Pharyngeal Impairment Domain: Pharyngeal Impairment Domain Soft palate elevation: No bolus between soft palate (SP)/pharyngeal wall (PW) Laryngeal elevation: Complete superior movement of thyroid cartilage with complete approximation of arytenoids to epiglottic petiole Anterior hyoid excursion: Partial anterior movement Epiglottic movement: Complete inversion Laryngeal vestibule closure: Incomplete, narrow column air/contrast in laryngeal vestibule Pharyngeal stripping wave : Present - complete Pharyngeal contraction (A/P view only): N/A Pharyngoesophageal segment opening: Complete distension and complete duration, no obstruction of flow Tongue base retraction: Narrow column of contrast or air between tongue base  and PPW Pharyngeal residue: Collection of residue within or on pharyngeal structures Location of pharyngeal residue: Valleculae; Pyriform sinuses; Tongue base; Pharyngeal wall; Diffuse (>3 areas)  Esophageal Impairment Domain: Esophageal Impairment Domain Esophageal clearance upright position: Complete clearance, esophageal coating Pill: No data recorded Penetration/Aspiration Scale Score: Penetration/Aspiration Scale Score 1.  Material does not enter airway: Moderately thick liquids (Level 3, honey thick); Puree; Solid; Mildly thick liquids (Level  2, nectar thick) 2.  Material enters airway, remains ABOVE vocal cords then ejected out: Thin liquids (Level 0) 5.  Material enters airway, CONTACTS cords and not ejected out: Thin liquids (Level 0) 7.  Material enters airway, passes BELOW cords and not ejected out despite cough attempt by patient: Thin liquids (Level 0) Compensatory Strategies: Compensatory Strategies Compensatory strategies: Yes Straw: Ineffective Ineffective Straw: Thin liquid (Level 0)   General Information: No data recorded Diet Prior to this Study: NPO   Temperature : Normal   Respiratory Status: WFL   Supplemental O2: None (Room air)   History of Recent Intubation: No  Behavior/Cognition: Alert; Cooperative; Requires cueing; Doesn't follow directions Self-Feeding Abilities: Able to self-feed; Needs set-up for self-feeding Baseline vocal quality/speech: Other (comment) (limited vocalizations heard) Volitional Cough: Unable to elicit Volitional Swallow: Unable to elicit Exam Limitations: Poor positioning Goal Planning: Prognosis for improved oropharyngeal function: Good Barriers to Reach Goals: Cognitive deficits; Severity of deficits No data recorded Patient/Family Stated Goal: patient unable to state Consulted and agree with results and recommendations: Pt unable/family or caregiver not available Pain: Pain Assessment Pain Assessment: Faces Faces Pain Scale: 0 Facial Expression: 0 Body Movements: 0 Muscle Tension: 0 Compliance with ventilator (intubated pts.): N/A Vocalization (extubated pts.): N/A CPOT Total: 0 Pain Location: Patient would occassionally grimace at times. Pain Descriptors / Indicators: Grimacing Pain Intervention(s): Monitored during session; Repositioned End of Session: Start Time:SLP Start Time (ACUTE ONLY): 9077 Stop Time: SLP Stop Time (ACUTE ONLY): 0942 Time Calculation:SLP Time Calculation (min) (ACUTE ONLY): 20 min Charges: SLP Evaluations $ SLP Speech Visit: 1 Visit SLP Evaluations $BSS Swallow: 1 Procedure $MBS  Swallow: 1 Procedure $ SLP EVAL LANGUAGE/SOUND PRODUCTION: 1 Procedure SLP visit diagnosis: SLP Visit Diagnosis: Dysphagia, oropharyngeal phase (R13.12) Past Medical History: Past Medical History: Diagnosis Date  Abnormal stress test   Low risk but abnormal stress test  DDD (degenerative disc disease)   Hypertension   Tobacco abuse  Past Surgical History: Past Surgical History: Procedure Laterality Date  LUMBAR FUSION   Norleen IVAR Blase, MA, CCC-SLP Speech Therapy      Vernal Alstrom, MD  [facility] 01/21/2024  If 7PM-7AM, please contact night-coverage

## 2024-01-22 LAB — GLUCOSE, CAPILLARY
Glucose-Capillary: 169 mg/dL — ABNORMAL HIGH (ref 70–99)
Glucose-Capillary: 156 mg/dL — ABNORMAL HIGH (ref 70–99)
Glucose-Capillary: 127 mg/dL — ABNORMAL HIGH (ref 70–99)
Glucose-Capillary: 140 mg/dL — ABNORMAL HIGH (ref 70–99)
Glucose-Capillary: 149 mg/dL — ABNORMAL HIGH (ref 70–99)
Glucose-Capillary: 144 mg/dL — ABNORMAL HIGH (ref 70–99)

## 2024-01-22 LAB — BASIC METABOLIC PANEL WITH GFR
Anion gap: 10 (ref 5–15)
BUN: 16 mg/dL (ref 8–23)
CO2: 22 mmol/L (ref 22–32)
Calcium: 9 mg/dL (ref 8.9–10.3)
Chloride: 107 mmol/L (ref 98–111)
Creatinine, Ser: 1.05 mg/dL (ref 0.61–1.24)
GFR, Estimated: >60 mL/min (ref >60)
Glucose, Bld: 148 mg/dL — ABNORMAL HIGH (ref 70–99)
Potassium: 4 mmol/L (ref 3.5–5.1)
Sodium: 139 mmol/L (ref 135–145)

## 2024-01-22 LAB — PHOSPHORUS: Phosphorus: 4 mg/dL (ref 2.5–4.6)

## 2024-01-22 LAB — MAGNESIUM: Magnesium: 2.3 mg/dL (ref 1.7–2.4)

## 2024-01-22 MED ORDER — ASPIRIN 81 MG PO CHEW
81.0000 mg | CHEWABLE_TABLET | Freq: Every day | ORAL | Status: DC
  Administered 2024-01-22 – 2024-01-24 (×3): 81 mg
  Filled 2024-01-22 (×3): qty 1

## 2024-01-22 MED ORDER — LOSARTAN POTASSIUM 25 MG PO TABS
100.0000 mg | ORAL_TABLET | Freq: Every day | ORAL | Status: DC
  Administered 2024-01-22 – 2024-01-24 (×3): 100 mg via ORAL
  Filled 2024-01-22: qty 4
  Filled 2024-01-22 (×2): qty 2

## 2024-01-22 MED ORDER — ATORVASTATIN CALCIUM 80 MG PO TABS
80.0000 mg | ORAL_TABLET | Freq: Every day | ORAL | Status: DC
  Administered 2024-01-22 – 2024-01-24 (×3): 80 mg
  Filled 2024-01-22 (×3): qty 1

## 2024-01-22 MED ORDER — AMLODIPINE BESYLATE 10 MG PO TABS
10.0000 mg | ORAL_TABLET | Freq: Every day | ORAL | Status: DC
  Administered 2024-01-23 – 2024-01-24 (×2): 10 mg via ORAL
  Filled 2024-01-22: qty 1
  Filled 2024-01-22: qty 2

## 2024-01-22 NOTE — Progress Notes (Signed)
 Speech Language Pathology Treatment: Dysphagia;Cognitive-Linguistic  Patient Details Name: Edward Phelps MRN: [id number] DOB: 01/08/1961 Today's Date: 01/22/2024 Time: 8854-8779 SLP Time Calculation (min) (ACUTE ONLY): 35 min  Assessment / Plan / Recommendation Clinical Impression  After interventions today pt ready to initiate puree/honey thick diet with full supervision, up in chair, with reminders for small sips. Pills whole in puree.   Pt seen up in chair, alert and nodding yes to most questions. Pt did not initially verbalize or gesture to express himself; when offered PO trials pt accepted them but did not follow verbal or gesture cues for safety. Pt determined to be RARELY SUCCESSFUL in goals of 1. Communicating with a trained communication partner and 2. Demonstrating understanding of diagnosis.  By end of session pt determined to be PARTIALLY SUCCESSFUL in underlined goals. Signs posted in room to reinforce pt understanding of diagnosis (dysphagia).   SLP utilized simple line drawings and with gestures and acting out feeding to reinforce pts comprehension. Pt shook head no to incorrect written name and image of a house. Pt able to point to drawing of a hospital to indicate orientation to place and point to the word 'stroke?' and nod yes to indicate understanding of diagnosis after touching and holding his own right hand and examining loss of function. Pt able to verbalize one, two, three, four, five after listening to SLP count, seeing written numbers, singing take me out the ball game and counting with his own hand.   Pt able to self feed sips of thin, nectar and honey thick liquids and self feed purees. Pt had coughing after thins and nectar but did not cough with honey or puree. Mild oral residue and anterior spillage observed. Pt took very large sips and was given gestures, verbal instruction, line drawings and acting out to demonstrate danger with large sips and safety with small  sips. Pt return demonstrated small sips x5 with honey thick liquids.       HPI HPI: Patient is a 63 y.o. male who presented to the hospital via EMS on 01/18/24 after his boss from work  went to check on him and found him sitting on the floor with right facial droop and unable to talk. In ED, CXR did not show any active disease, EKG showed sinus tachycardia, MRI brain showed acute left MCA territory infarcts but no midline shift. 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 He has been NPO since admitted after failing [facility] swallow screen with RN. BSE completed by SLP on 11/25 and recommending NPO pending completion of MBS.PMH: HTN, heavy tobacco use, heavy ETOH use, chronic low back pain, DDD, medication non-compliance.      SLP Plan  Continue with current plan of care          Recommendations  Diet recommendations: Dysphagia 1 (puree);Honey-thick liquid Liquids provided via: Cup Medication Administration: Whole meds with puree Supervision: Staff to assist with self feeding Compensations: Slow rate;Small sips/bites Postural Changes and/or Swallow Maneuvers: Seated upright 90 degrees;Out of bed for meals                              Continue with current plan of care     Bach Rocchi, Consuelo Fitch  01/22/2024, 12:29 PM

## 2024-01-22 NOTE — Progress Notes (Signed)
 RN responded to patient's chair alarm and patient found down on fall mat. RN called for help getting patient up. No apparent signs of distress. Patient unable to verbalized pain. See LDA for patient's post fall skin assessment. Patient placed back in bed with bed alarm set and attached to call bell system, and fall mats down. Patient's son notified, MD notified and safety zone completed.

## 2024-01-22 NOTE — Progress Notes (Addendum)
 Physical Therapy Treatment Patient Details Name: Edward Phelps MRN: [id number] DOB: 1961-01-06 Today's Date: 01/22/2024   History of Present Illness 63 yo M adm 01/18/24 after found on floor by friend with Rt weakness and aphasia with Lt MCA infarcts. Outside LKW time for intervention. PMhx: HTN, tobacco use, DDD, lumbar fusion.    PT Comments  Pt received sitting on EOB with OT present. Pt continues to communicate by nodding head yes/no with no attempts to verbalize. Pt showed improvements in seated balance requiring MinA to MaxA with L hand on bed rail. Pt was able to stand with [facility] and R knee blocked with ModAx2. Required MaxAx2 to stand-pivot to the left as pt was unable to progress to taking steps. Once in the recliner, worked on trunk control with anterior trunk pulls with L hand. Pt continues to have no activation in R LE with PROM performed. Pt fatigued after OT session and requested to rest. Pt left in recliner with lift pad placed, chair alarm on and fall pad underneath feet. Continue to recommend post-acute rehab with TOC following.    If plan is discharge home, recommend the following: A lot of help with walking and/or transfers;A lot of help with bathing/dressing/bathroom;Assistance with cooking/housework;Direct supervision/assist for medications management;Assist for transportation;Supervision due to cognitive status;Assistance with feeding;Direct supervision/assist for financial management;Help with stairs or ramp for entrance   Can travel by private vehicle      No  Equipment Recommendations  Wheelchair (measurements PT);Wheelchair cushion (measurements PT);BSC/3in1       Precautions / Restrictions Precautions Precautions: Fall Recall of Precautions/Restrictions: Impaired Precaution/Restrictions Comments: RHB paresis, R inattention, exp/rec aphasia Restrictions Weight Bearing Restrictions Per Provider Order: No     Mobility  Bed Mobility    General bed mobility  comments: seated on EOB upon arrival with OT    Transfers Overall transfer level: Needs assistance Equipment used: 2 person hand held assist Transfers: Sit to/from Stand, Bed to chair/wheelchair/BSC Sit to Stand: Mod assist, +2 physical assistance, +2 safety/equipment Stand pivot transfers: Max assist, +2 physical assistance, +2 safety/equipment    General transfer comment: Stood from bed with cues for sequencing/hand placement, R knee blocked during SPT to chair, transferring to L side.    Ambulation/Gait  General Gait Details: unable this date     Modified Rankin (Stroke Patients Only) Modified Rankin (Stroke Patients Only) Pre-Morbid Rankin Score: No symptoms Modified Rankin: Severe disability     Balance Overall balance assessment: Needs assistance Sitting-balance support: Single extremity supported, Feet supported Sitting balance-Leahy Scale: Poor Sitting balance - Comments: pt heavily reliant on bed rail to sustain static and dynamic sitting, fluctuating between min-max A to sustain at EOB Postural control: Posterior lean, Right lateral lean Standing balance support: Bilateral upper extremity supported, During functional activity Standing balance-Leahy Scale: Zero Standing balance comment: max A +2 during SPT, poor sense of midline, heavily leaning to the R side       Communication Communication Communication: Impaired Factors Affecting Communication: Difficulty expressing self;Reduced clarity of speech (mostly groaning and moaning)  Cognition Arousal: Alert Behavior During Therapy: Flat affect   PT - Cognitive impairments: Difficult to assess, Awareness, Problem solving, Safety/Judgement, Initiation Difficult to assess due to: Impaired communication    PT - Cognition Comments: Able to respond to questions by nodding head yes/no Following commands: Impaired Following commands impaired: Follows one step commands inconsistently, Follows one step commands with  increased time    Cueing Cueing Techniques: Verbal cues, Gestural cues, Tactile cues, Visual cues  Exercises General Exercises - Lower Extremity Ankle Circles/Pumps: AROM, PROM, Both, 10 reps, Seated Long Arc Quad: PROM, AROM, Both, 10 reps, Seated Hip Flexion/Marching: PROM, Right, 10 reps, Seated Other Exercises Other Exercises: 2x5 anterior trunk pulls with L hand        Pertinent Vitals/Pain Pain Assessment Pain Assessment: No/denies pain Pain Intervention(s): Monitored during session     PT Goals (current goals can now be found in the care plan section) Acute Rehab PT Goals PT Goal Formulation: With family Time For Goal Achievement: 02/02/24 Potential to Achieve Goals: Good Progress towards PT goals: Progressing toward goals    Frequency    Min 3X/week       AM-PAC PT 6 Clicks Mobility   Outcome Measure  Help needed turning from your back to your side while in a flat bed without using bedrails?: A Lot Help needed moving from lying on your back to sitting on the side of a flat bed without using bedrails?: A Lot Help needed moving to and from a bed to a chair (including a wheelchair)?: Total Help needed standing up from a chair using your arms (e.g., wheelchair or bedside chair)?: Total Help needed to walk in hospital room?: Total Help needed climbing 3-5 steps with a railing? : Total 6 Click Score: 8    End of Session Equipment Utilized During Treatment: Gait belt Activity Tolerance: Patient tolerated treatment well Patient left: in chair;with call bell/phone within reach;with chair alarm set;Other (comment) (fall mat underneath feet and hoyer lift pad placed) Nurse Communication: Mobility status;Other (comment) (notified NT about need for hoyer lift, requested for 1 hour after session) PT Visit Diagnosis: Other abnormalities of gait and mobility (R26.89);Difficulty in walking, not elsewhere classified (R26.2);Muscle weakness (generalized) (M62.81);Hemiplegia  and hemiparesis Hemiplegia - Right/Left: Right Hemiplegia - dominant/non-dominant: Dominant Hemiplegia - caused by: Cerebral infarction     Time: 1040-1105 PT Time Calculation (min) (ACUTE ONLY): 25 min  Charges:    $Therapeutic Exercise: 8-22 mins PT General Charges $$ ACUTE PT VISIT: 1 Visit                    Kate ORN, PT, DPT Secure Chat Preferred  Rehab Office [phone]   Kate BRAVO Wendolyn 01/22/2024, 2:12 PM

## 2024-01-22 NOTE — Progress Notes (Addendum)
 PROGRESS NOTE  BERNARDO BRAYMAN FMW:[id number] DOB: 1960-11-26 DOA: 01/18/2024 PCP: Medicine, [facility]   LOS: 4 days   Brief narrative:  63 y.o. male with medical history significant for hypertension, tobacco use disorder, chronic low back pain, DDD, and medication noncompliance presented to hospital with right-sided weakness facial droop and aphasia.  MRI brain showed acute left MCA territory infarcts but no midline shift. 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 Neurology was consulted and patient was admitted to hospital for further evaluation and treatment.  At this time patient has profound dysphagia and is on cortrak tube tube.  Will need CIR on discharge.   Assessment/Plan: Principal Problem:   Acute ischemic left MCA stroke (HCC) Active Problems:   Malnutrition of moderate degree  Acute left MCA infarct MRI  brain with left MCA infarct. 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  Patient was outside the window for thrombolytic treatment and was started on aspirin .  Neurology on board.  2D echocardiogram with LV ejection fraction of 45 to 50% with regional wall motion abnormality and indeterminate diastolic dysfunction.  No evidence of interatrial shunt.  Hemoglobin A1c of 5.6,  LDL of 133.  Speech therapy recommended n.p.o. due to profound dysphagia so on cortrak tube tube feeding.  Plavix on hold for possibility of needing PEG tube placement.  Plan to continue Lipitor  80 mg daily.  Cough, oral secretion  likely secondary to severe dysphagia and secretions.  History of smoking and possible COPD.  Continue albuterol  nebulizer, suction throat as needed.  Aspiration precautions.  Nutrition.  Status post cortrak tube tube placement and tube feeding.  Dietitian on board.  Monitor electrolytes while on tube feeding.  Follow speech therapy recommendations whether patient would need PEG tube in the long-term versus cortrak tube tube for now.    Essential hypertension Blood pressure was uncontrolled on presentation secondary to noncompliance to medication.  Continue amlodipine  and losartan .  Latest blood pressure of 157/80  Acute kidney injury versus CKD stage II Creatinine of 1.4 on presentation.  At this time creatinine has improved to 1.0.     History of degenerative disc disease Continue Tylenol  and Lidoderm  patch.   Tobacco use disorder Continue nicotine  patch  DVT prophylaxis: enoxaparin  (LOVENOX ) injection 40 mg Start: 01/19/24 1000   Disposition: Likely to CIR as per PT evaluation  Status is: Inpatient  Remains inpatient appropriate because: Pending clinical improvement, need for rehabilitation, cortrak tube tube feeding,    Code Status:     Code Status: Full Code  Family Communication: None at bedside, spoke with the patient's son and updated him about the clinical condition of the patient.   Consultants: Neurology  Procedures: None  Anti-infectives:  None  Anti-infectives (From admission, onward)    None       Subjective: Today, patient was seen and examined at bedside.  Patient with expressive aphasia but able to follow commands.  Has some dysphagia and upper airway congestion with intermittent cough and secretions  Objective: Vitals:   01/22/24 0340 01/22/24 0752  BP: (!) 166/78 (!) 157/80  Pulse: 84 87  Resp:  20  Temp: 97.9 F (36.6 C) 98.5 F (36.9 C)  SpO2: 96% 95%    Intake/Output Summary (Last 24 hours) at 01/22/2024 1006 Last data filed at 01/22/2024 0500 Gross per 24 hour  Intake 485.33 ml  Output 800 ml  Net -314.67 ml   Filed Weights   01/19/24 1608  01/21/24 0100 01/22/24 0500  Weight: 77.1 kg 69.7 kg 68.9 kg   Body mass index is 21.79 kg/m.   Physical Exam: GENERAL: Patient is alert awake and follows commands, expressive aphasia, not in obvious distress.   HENT: No scleral pallor or icterus. Pupils equally reactive to light. Oral mucosa is moist NECK: is  supple, no gross swelling noted. CHEST: Diminished breath sounds bilaterally.  Coarse breath sounds noted. CVS: S1 and S2 heard, no murmur. Regular rate and rhythm.  ABDOMEN: Soft, non-tender, bowel sounds are present. EXTREMITIES: No edema.  Right hemiplegia with neglect. CNS right facial weakness. right-sided flaccid weakness, expressive aphasia SKIN: warm and dry without rashes.  Data Review: I have personally reviewed the following laboratory data and studies,  CBC: Recent Labs  Lab 01/18/24 1922 01/18/24 1948 01/21/24 0113  WBC 8.9  --  8.4  HGB 16.2 15.6 16.7  HCT 47.7 46.0 47.6  MCV 96.4  --  93.7  PLT 240  --  242   Basic Metabolic Panel: Recent Labs  Lab 01/19/24 0225 01/19/24 2051 01/20/24 0108 01/20/24 1416 01/21/24 0113 01/22/24 0120  NA 135 136 137  --  136 139  K 4.1 3.9 3.7  --  3.3* 4.0  CL 102 101 104  --  105 107  CO2 18* 17* 19*  --  20* 22  GLUCOSE 79 92 88  --  152* 148*  BUN 17 11 13   --  18 16  CREATININE 1.26* 1.18 1.12  --  1.04 1.05  CALCIUM  8.7* 8.5* 8.7*  --  8.6* 9.0  MG  --  2.1  --  2.2 2.2 2.3  PHOS  --   --   --  2.9 2.7 4.0   Liver Function Tests: Recent Labs  Lab 01/19/24 0225  AST 26  ALT 21  ALKPHOS 63  BILITOT 3.3*  PROT 6.6  ALBUMIN 3.5   No results for input(s): LIPASE, AMYLASE in the last 168 hours. No results for input(s): AMMONIA in the last 168 hours. Cardiac Enzymes: Recent Labs  Lab 01/18/24 1922  CKTOTAL 309   BNP (last 3 results) No results for input(s): BNP in the last 8760 hours.  ProBNP (last 3 results) No results for input(s): PROBNP in the last 8760 hours.  CBG: Recent Labs  Lab 01/21/24 1514 01/21/24 1951 01/21/24 2346 01/22/24 0342 01/22/24 0804  GLUCAP 170* 156* 162* 169* 156*   No results found for this or any previous visit (from the past 240 hours).   Studies: No results found.     Vernal Alstrom, MD  [facility] 01/22/2024  If 7PM-7AM, please contact  night-coverage

## 2024-01-22 NOTE — Progress Notes (Addendum)
 STROKE TEAM PROGRESS NOTE   INTERIM HISTORY/SUBJECTIVE No family at the bedside.  Sitting in the chair in no apparent distress.  Neurological exam remains stable and unchanged    CBC    Component Value Date/Time   WBC 8.4 01/21/2024 0113   RBC 5.08 01/21/2024 0113   HGB 16.7 01/21/2024 0113   HCT 47.6 01/21/2024 0113   PLT 242 01/21/2024 0113   MCV 93.7 01/21/2024 0113   MCH 32.9 01/21/2024 0113   MCHC 35.1 01/21/2024 0113   RDW 13.3 01/21/2024 0113    BMET    Component Value Date/Time   NA 139 01/22/2024 0120   K 4.0 01/22/2024 0120   CL 107 01/22/2024 0120   CO2 22 01/22/2024 0120   GLUCOSE 148 (H) 01/22/2024 0120   BUN 16 01/22/2024 0120   CREATININE 1.05 01/22/2024 0120   CALCIUM  9.0 01/22/2024 0120   GFRNONAA >60 01/22/2024 0120    IMAGING past 24 hours No results found.  Vitals:   01/21/24 2347 01/22/24 0340 01/22/24 0500 01/22/24 0752  BP: (!) 172/84 (!) 166/78  (!) 157/80  Pulse: 79 84  87  Resp:    20  Temp: 99.1 F (37.3 C) 97.9 F (36.6 C)  98.5 F (36.9 C)  TempSrc: Oral Oral  Oral  SpO2: 97% 96%  95%  Weight:   68.9 kg   Height:         PHYSICAL EXAM General: Lethargic, well-developed patient in no acute distress CV: Regular rate and rhythm on monitor Respiratory:  Regular, unlabored respirations on room air GI: Abdomen soft and nontender   NEURO:  Eyes open, global aphasia and not able to answer questions.  Intermittently following simple commands or pantomime except closed eyes on command. Not able to name or repeat. Eyes left gaze preference but cross midline, R gaze incomplete, R visual neglect. R facial droop. Tongue protrusion not cooperative. LUE and LLE seems to be 5/5, RUE and RLE mild withdraw to pain. Sensation, coordination and gait not tested     ASSESSMENT/PLAN Edward Phelps is a 63 y.o. male with history of  hypertension, tobacco abuse, chronic low back pain due to old back injury at work, presenting for evaluation of  right-sided weakness and inability to talk as well as right-sided facial drooping which was noticed by family today.  Last known well when he came back from work Friday and has since not been spoken to.   NIH on Admission 13   Stroke:  left MCA territory infarcts, etiology: Likely large vessel disease MRI  Acute left MCA territory infarcts.  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 Reconstitution of the left paraclinoid ICA and proximal left M1 MCA with abrupt occlusion of the left mid M1 MCA. Poor distal left MCA opacification/collaterals. 2D Echo EF 45 to 50% LDL 133 HgbA1c 5.6 UDS + THC VTE prophylaxis -Lovenox  aspirin  81 mg daily prior to admission, now on ASA 81. Holding off plavix for now given possible PEG placement Therapy recommendations:  SNF Disposition: Pending   Hypertension Home meds: Amlodipine  5 mg, losartan  100 mg (not sure about compliance) Stable on the high end Avoid low BP Now on amlodipine  5->10 and losartan  50->100 Gradually lower BP to goal in 2-3 days Long term BP goal 130-150 given L ICA and MCA occlusion   Hyperlipidemia Home meds: None LDL 133, goal < 70 Add atorvastatin  80  Continue statin at discharge   Tobacco Abuse ETOH abuse  Patient  smokes a lot per son, and has many beers during the day  On nicotine  patch Smoking cessation and alcohol limitation education will be provided CIWA protocol Thiamine  and folate and multivitamin daily DT and seizure precaution   THC Abuse Patient uses THC intermittently per family UDS + THC TOC consult for cessation placed   Dysphagia Patient has post-stroke dysphagia, SLP consulted Currently on dysphagia 1 and honey thick liquid S/p cortrak placement On TF @ 54    Other Stroke Risk Factors Congestive heart failure, EF 45-50%   Other Active Problems AKI, creatinine 1.4-1.26--1.12--1.04, off IVF on TF  Hospital day # 4   Karna Geralds DNP, ACNPC-AG  [facility]  ATTENDING  NOTE: I reviewed above note and agree with the assessment and plan. Pt was seen and examined.   No family at bedside.  Patient lying bed, still has global aphasia, nonverbal, but seem to follow 1-2 simple commands.  Still has right hemianopia and neglect.  Right hemiplegia.  Passed swallow on dysphagia 100 cc liquid.  Will need close watch to avoid aspiration, if able to void PEG, will start DAPT, continue statin.  PT and OT recommend SNF.  For detailed assessment and plan, please refer to above as I have made changes wherever appropriate.   Ary Cummins, MD PhD Stroke Neurology 01/22/2024 6:57 PM     To contact Stroke Continuity provider, please refer to [email or web address]. After hours, contact General Neurology

## 2024-01-22 NOTE — Progress Notes (Signed)
 STROKE TEAM PROGRESS NOTE   INTERIM HISTORY/SUBJECTIVE No family at the bedside.  Pt lying in bed, eye open, left gaze preference, R hemiplegia with neglect, on cortrak for TF, intermittent cough with secretions in throat.    CBC    Component Value Date/Time   WBC 8.4 01/21/2024 0113   RBC 5.08 01/21/2024 0113   HGB 16.7 01/21/2024 0113   HCT 47.6 01/21/2024 0113   PLT 242 01/21/2024 0113   MCV 93.7 01/21/2024 0113   MCH 32.9 01/21/2024 0113   MCHC 35.1 01/21/2024 0113   RDW 13.3 01/21/2024 0113    BMET    Component Value Date/Time   NA 136 01/21/2024 0113   K 3.3 (L) 01/21/2024 0113   CL 105 01/21/2024 0113   CO2 20 (L) 01/21/2024 0113   GLUCOSE 152 (H) 01/21/2024 0113   BUN 18 01/21/2024 0113   CREATININE 1.04 01/21/2024 0113   CALCIUM  8.6 (L) 01/21/2024 0113   GFRNONAA >60 01/21/2024 0113    IMAGING past 24 hours No results found.   Vitals:   01/21/24 1512 01/21/24 1952 01/21/24 2233 01/21/24 2347  BP: (!) 166/120 (!) 180/88 (!) 159/82 (!) 172/84  Pulse: 96 92 89 79  Resp: 16  20   Temp: 98.6 F (37 C) 98.4 F (36.9 C) 98.8 F (37.1 C) 99.1 F (37.3 C)  TempSrc:  Oral Oral Oral  SpO2: 98% 97% 98% 97%  Weight:      Height:         PHYSICAL EXAM General: Lethargic, well-developed patient in no acute distress CV: Regular rate and rhythm on monitor Respiratory:  Regular, unlabored respirations on room air GI: Abdomen soft and nontender  NEURO:  Eyes open, global aphasia and not able to answer questions. Not following simple commands or pantomime except closed eyes on command. Not able to name or repeat. Eyes left gaze preference but cross midline, R gaze incomplete, R visual neglect. R facial droop. Tongue protrusion not cooperative. LUE and LLE seems to be 5/5, RUE and RLE mild withdraw to pain. Sensation, coordination and gait not tested    ASSESSMENT/PLAN  Mr. GLENROY CROSSEN is a 63 y.o. male with history of  hypertension, tobacco abuse, chronic  low back pain due to old back injury at work, presenting for evaluation of right-sided weakness and inability to talk as well as right-sided facial drooping which was noticed by family today.  Last known well when he came back from work Friday and has since not been spoken to.   NIH on Admission 13  Stroke:  left MCA territory infarcts, etiology: Likely large vessel disease MRI  Acute left MCA territory infarcts.  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 Reconstitution of the left paraclinoid ICA and proximal left M1 MCA with abrupt occlusion of the left mid M1 MCA. Poor distal left MCA opacification/collaterals. 2D Echo EF 45 to 50% LDL 133 HgbA1c 5.6 UDS + THC VTE prophylaxis -Lovenox  aspirin  81 mg daily prior to admission, now on ASA 81. Holding off plavix  for now given possible PEG placement Therapy recommendations:  SNF Disposition: Pending  Hypertension Home meds: Amlodipine  5 mg, losartan  100 mg (not sure about compliance) Stable on the high end Avoid low BP Now on amlodipine  5 and losartan  50->100 Gradually lower BP to goal in 2-3 days Long term BP goal 130-150 given L ICA and MCA occlusion  Hyperlipidemia Home meds: None LDL 133, goal < 70 Add atorvastatin  80  Continue statin  at discharge  Tobacco Abuse ETOH abuse  Patient smokes a lot per son, and has many beers during the day  Smoking cessation and alcohol limitation education will be provided CIWA protocol Thiamine  and folate and multivitamin daily DT and seizure precaution  THC Abuse Patient uses THC intermittently per family UDS + THC TOC consult for cessation placed  Dysphagia Patient has post-stroke dysphagia, SLP consulted Currently n.p.o. S/p cortrak placement On TF @ 54   Other Stroke Risk Factors Congestive heart failure, EF 45-50%  Other Active Problems AKI, creatinine 1.4-1.26--1.12--1.04, off IVF on [facility] day # 4    Ary Cummins, MD PhD Stroke Neurology 01/22/2024 1:29  AM       To contact Stroke Continuity provider, please refer to [email or web address]. After hours, contact General Neurology

## 2024-01-22 NOTE — Plan of Care (Signed)
  Problem: Pain Managment: Goal: General experience of comfort will improve and/or be controlled Outcome: Progressing   Problem: Safety: Goal: Ability to remain free from injury will improve Outcome: Progressing

## 2024-01-22 NOTE — Progress Notes (Signed)
 Occupational Therapy Treatment Patient Details Name: Edward Phelps MRN: [id number] DOB: [birth year] Today's Date: 01/22/2024   History of present illness 63 yo M adm 01/18/24 after found on floor by friend with Rt weakness and aphasia with Lt MCA infarcts. Outside LKW time for intervention. PMhx: HTN, tobacco use, DDD, lumbar fusion.   OT comments  Pt received in bed, agreeable for OT visit with encouragement. Remains heavily limited by R hemiplegia and expressive & receptive language deficits. Focus of today's session on neuromuscular rehab of R UE and progressing seated balance/midline re-orientation. Pt required max A to come to EOB for trunk elevation and RHB mgmt. Initially with heavy posterior and R lateral lean, corrected well once prompted to use bed rail to L. Remains inattentive to R environment/body, requiring mod A for UB dressing and bathing with dry washcloth. Tactile stim to RUE for improved neuro proprioceptive input in preparation for ADLs. Partial co-tx with PT to maximize safety and therapeutic approach for stand pivot to chair. Left with PT upon OT departure. Pt making gains towards OT goals, will continue to follow.      If plan is discharge home, recommend the following:  Two people to help with walking and/or transfers;Two people to help with bathing/dressing/bathroom;Assistance with cooking/housework;Assistance with feeding;Direct supervision/assist for medications management;Direct supervision/assist for financial management;Assist for transportation;Help with stairs or ramp for entrance;Supervision due to cognitive status   Equipment Recommendations  Other (comment) (defer)    Recommendations for Other Services      Precautions / Restrictions Precautions Precautions: Fall Recall of Precautions/Restrictions: Impaired Precaution/Restrictions Comments: RHB paresis, R inattention, exp/rec aphasia Restrictions Weight Bearing Restrictions Per Provider Order: No        Mobility Bed Mobility Overal bed mobility: Needs Assistance Bed Mobility: Supine to Sit     Supine to sit: Max assist, HOB elevated     General bed mobility comments: cued for initiation and sequencing, pt able to initiate bringing LLE towards EOB, needs A for RHB mgmt and trunk elevation; exited to the L side    Transfers Overall transfer level: Needs assistance Equipment used: 2 person hand held assist Transfers: Sit to/from Stand, Bed to chair/wheelchair/BSC Sit to Stand: Mod assist, +2 physical assistance, +2 safety/equipment Stand pivot transfers: Max assist, +2 physical assistance, +2 safety/equipment         General transfer comment: Stood from bed with cues for sequencing/hand placement, R knee blocked during SPT to chair, transferring to L side.     Balance Overall balance assessment: Needs assistance Sitting-balance support: Single extremity supported, Feet supported Sitting balance-Leahy Scale: Poor Sitting balance - Comments: pt heavily reliant on bed rail to sustain static and dynamic sitting, fluctuating between min-max A to sustain at EOB Postural control: Posterior lean, Right lateral lean Standing balance support: Bilateral upper extremity supported, During functional activity Standing balance-Leahy Scale: Zero Standing balance comment: max A +2 during SPT, poor sense of midline, heavily leaning to the R side                           ADL either performed or assessed with clinical judgement   ADL Overall ADL's : Needs assistance/impaired         Upper Body Bathing: Moderate assistance;Sitting Upper Body Bathing Details (indicate cue type and reason): assist to use washcloth with L hand to cross midline and wash entire RUE with dry washcloth, cues for locating RUE and reaching proximally towards shoulder for thoroughness  Upper Body Dressing : Moderate assistance;Bed level Upper Body Dressing Details (indicate cue type and reason):  sequencing and initiation cues, assist for problem solving how to unthread RUE from dirty gown Lower Body Dressing: Total assistance;Sitting/lateral leans Lower Body Dressing Details (indicate cue type and reason): assist for adjusting B socks             Functional mobility during ADLs: Moderate assistance;Maximal assistance      Extremity/Trunk Assessment Upper Extremity Assessment Upper Extremity Assessment: RUE deficits/detail RUE Deficits / Details: remains flaccid RUE Sensation: decreased light touch RUE Coordination: decreased fine motor;decreased gross motor            Vision   Additional Comments: pt with difficulty fixating on visual stim in R environment, unable to formally assess 2/2 impaired cog/comm and command following   Perception Perception Perception: Impaired Preception Impairment Details: Inattention/Neglect Perception-Other Comments: dense R inattention, L gaze pref   Praxis Praxis Praxis: Impaired Praxis Impairment Details: Organization;Motor planning   Communication Communication Communication: Impaired Factors Affecting Communication: Difficulty expressing self;Reduced clarity of speech (mostly groaning and moaning)   Cognition Arousal: Alert Behavior During Therapy: Flat affect               OT - Cognition Comments: participatory with encouragement; appears globally aphasic (limiting assessment of cognition), poor problem solving, sequencing, and initiation (inconsistent)                 Following commands: Impaired Following commands impaired: Follows one step commands inconsistently, Follows one step commands with increased time      Cueing   Cueing Techniques: Verbal cues, Gestural cues, Tactile cues, Visual cues  Exercises      Shoulder Instructions       General Comments      Pertinent Vitals/ Pain       Pain Assessment Pain Assessment: Faces Faces Pain Scale: No hurt  Home Living                                           Prior Functioning/Environment              Frequency  Min 2X/week        Progress Toward Goals  OT Goals(current goals can now be found in the care plan section)  Progress towards OT goals: Progressing toward goals     Plan      Co-evaluation                 AM-PAC OT 6 Clicks Daily Activity     Outcome Measure   Help from another person eating meals?: Total Help from another person taking care of personal grooming?: A Lot Help from another person toileting, which includes using toliet, bedpan, or urinal?: Total Help from another person bathing (including washing, rinsing, drying)?: A Lot Help from another person to put on and taking off regular upper body clothing?: A Lot Help from another person to put on and taking off regular lower body clothing?: Total 6 Click Score: 9    End of Session Equipment Utilized During Treatment: Gait belt  OT Visit Diagnosis: Unsteadiness on feet (R26.81);Other abnormalities of gait and mobility (R26.89);Hemiplegia and hemiparesis;Other symptoms and signs involving cognitive function;Other symptoms and signs involving the nervous system (R29.898);Cognitive communication deficit (R41.841);Muscle weakness (generalized) (M62.81) Symptoms and signs involving cognitive functions: Cerebral infarction Hemiplegia - Right/Left: Right Hemiplegia -  dominant/non-dominant: Dominant Hemiplegia - caused by: Cerebral infarction   Activity Tolerance Patient tolerated treatment well   Patient Left in chair;with call bell/phone within reach;with chair alarm set   Nurse Communication Mobility status;Need for lift equipment        Time: 1026-1050 OT Time Calculation (min): 24 min  Charges: OT General Charges $OT Visit: 1 Visit OT Treatments $Self Care/Home Management : 8-22 mins $Therapeutic Activity: 8-22 mins  Kaion Tisdale M. [place], OTR/L [facility] [phone] Secure Chat  Preferred  Kaedance Magos 01/22/2024, 1:24 PM

## 2024-01-23 ENCOUNTER — Inpatient Hospital Stay (HOSPITAL_COMMUNITY): Payer: MEDICAID

## 2024-01-23 DIAGNOSIS — T17908A Unspecified foreign body in respiratory tract, part unspecified causing other injury, initial encounter: Secondary | ICD-10-CM

## 2024-01-23 DIAGNOSIS — I63232 Cerebral infarction due to unspecified occlusion or stenosis of left carotid arteries: Secondary | ICD-10-CM

## 2024-01-23 LAB — CBC
HCT: 50.5 % (ref 39.0–52.0)
Hemoglobin: 17.5 g/dL — ABNORMAL HIGH (ref 13.0–17.0)
MCH: 33.1 pg (ref 26.0–34.0)
MCHC: 34.7 g/dL (ref 30.0–36.0)
MCV: 95.6 fL (ref 80.0–100.0)
Platelets: 243 K/uL (ref 150–400)
RBC: 5.28 MIL/uL (ref 4.22–5.81)
RDW: 13.5 % (ref 11.5–15.5)
WBC: 9.6 K/uL (ref 4.0–10.5)
nRBC: 0 % (ref 0.0–0.2)

## 2024-01-23 LAB — BASIC METABOLIC PANEL WITH GFR
Anion gap: 13 (ref 5–15)
BUN: 17 mg/dL (ref 8–23)
CO2: 22 mmol/L (ref 22–32)
Calcium: 9.6 mg/dL (ref 8.9–10.3)
Chloride: 103 mmol/L (ref 98–111)
Creatinine, Ser: 1.12 mg/dL (ref 0.61–1.24)
GFR, Estimated: >60 mL/min (ref >60)
Glucose, Bld: 153 mg/dL — ABNORMAL HIGH (ref 70–99)
Potassium: 4.3 mmol/L (ref 3.5–5.1)
Sodium: 138 mmol/L (ref 135–145)

## 2024-01-23 LAB — POCT I-STAT 7, (LYTES, BLD GAS, ICA,H+H)
Acid-Base Excess: 1 mmol/L (ref 0.0–2.0)
Bicarbonate: 25.7 mmol/L (ref 20.0–28.0)
Calcium, Ion: 1.29 mmol/L (ref 1.15–1.40)
HCT: 50 % (ref 39.0–52.0)
Hemoglobin: 17 g/dL (ref 13.0–17.0)
O2 Saturation: 100 %
Patient temperature: 37
Potassium: 4.5 mmol/L (ref 3.5–5.1)
Sodium: 140 mmol/L (ref 135–145)
TCO2: 27 mmol/L (ref 22–32)
pCO2 arterial: 40.5 mmHg (ref 32–48)
pH, Arterial: 7.411 (ref 7.35–7.45)
pO2, Arterial: 384 mmHg — ABNORMAL HIGH (ref 83–108)

## 2024-01-23 LAB — LACTIC ACID, PLASMA
Lactic Acid, Venous: 2.3 mmol/L (ref 0.5–1.9)
Lactic Acid, Venous: 1.9 mmol/L (ref 0.5–1.9)

## 2024-01-23 LAB — GLUCOSE, CAPILLARY
Glucose-Capillary: 133 mg/dL — ABNORMAL HIGH (ref 70–99)
Glucose-Capillary: 136 mg/dL — ABNORMAL HIGH (ref 70–99)
Glucose-Capillary: 139 mg/dL — ABNORMAL HIGH (ref 70–99)
Glucose-Capillary: 149 mg/dL — ABNORMAL HIGH (ref 70–99)
Glucose-Capillary: 150 mg/dL — ABNORMAL HIGH (ref 70–99)
Glucose-Capillary: 151 mg/dL — ABNORMAL HIGH (ref 70–99)
Glucose-Capillary: 159 mg/dL — ABNORMAL HIGH (ref 70–99)

## 2024-01-23 LAB — TROPONIN I (HIGH SENSITIVITY)
Troponin I (High Sensitivity): 133 ng/L (ref <18)
Troponin I (High Sensitivity): 107 ng/L (ref <18)

## 2024-01-23 LAB — BRAIN NATRIURETIC PEPTIDE: B Natriuretic Peptide: 1050.1 pg/mL — ABNORMAL HIGH (ref 0.0–100.0)

## 2024-01-23 LAB — PHOSPHORUS: Phosphorus: 4.9 mg/dL — ABNORMAL HIGH (ref 2.5–4.6)

## 2024-01-23 LAB — MAGNESIUM: Magnesium: 2.4 mg/dL (ref 1.7–2.4)

## 2024-01-23 MED ORDER — PROPOFOL 1000 MG/100ML IV EMUL: 0.0000 ug/kg/min | INTRAVENOUS | Status: DC

## 2024-01-23 MED ORDER — FENTANYL CITRATE (PF) 50 MCG/ML IJ SOSY: 50.0000 ug | PREFILLED_SYRINGE | INTRAMUSCULAR | Status: DC | PRN

## 2024-01-23 MED ORDER — FENTANYL CITRATE (PF) 50 MCG/ML IJ SOSY: 50.0000 ug | PREFILLED_SYRINGE | Freq: Once | INTRAMUSCULAR | Status: AC

## 2024-01-23 MED ORDER — LEVETIRACETAM (KEPPRA) 500 MG/5 ML ADULT IV PUSH
2500.0000 mg | INTRAVENOUS | Status: AC
  Administered 2024-01-23: 2500 mg via INTRAVENOUS
  Filled 2024-01-23: qty 25

## 2024-01-23 MED ORDER — PIPERACILLIN-TAZOBACTAM 3.375 G IVPB: 3.3750 g | Freq: Four times a day (QID) | INTRAVENOUS | Status: DC

## 2024-01-23 MED ORDER — PIPERACILLIN-TAZOBACTAM 3.375 G IVPB
3.3750 g | Freq: Three times a day (TID) | INTRAVENOUS | Status: DC
  Administered 2024-01-24 – 2024-01-25 (×4): 3.375 g via INTRAVENOUS
  Filled 2024-01-23 (×3): qty 50

## 2024-01-23 MED ORDER — ETOMIDATE 2 MG/ML IV SOLN: 20.0000 mg | Freq: Once | INTRAVENOUS | Status: AC

## 2024-01-23 MED ORDER — LACTATED RINGERS IV SOLN: INTRAVENOUS | Status: DC

## 2024-01-23 MED ORDER — DOCUSATE SODIUM 50 MG/5ML PO LIQD
100.0000 mg | Freq: Two times a day (BID) | ORAL | Status: DC
  Administered 2024-01-24 (×3): 100 mg
  Filled 2024-01-23 (×3): qty 10

## 2024-01-23 MED ORDER — ADULT MULTIVITAMIN W/MINERALS CH
1.0000 | ORAL_TABLET | Freq: Every day | ORAL | Status: DC
  Administered 2024-01-24 (×2): 1
  Filled 2024-01-23 (×2): qty 1

## 2024-01-23 MED ORDER — INSULIN ASPART 100 UNIT/ML IJ SOLN
2.0000 [IU] | INTRAMUSCULAR | Status: DC
  Administered 2024-01-23: 2 [IU] via SUBCUTANEOUS
  Administered 2024-01-24: 4 [IU] via SUBCUTANEOUS
  Administered 2024-01-24 (×2): 2 [IU] via SUBCUTANEOUS
  Administered 2024-01-24: 4 [IU] via SUBCUTANEOUS
  Administered 2024-01-24: 2 [IU] via SUBCUTANEOUS
  Administered 2024-01-24: 4 [IU] via SUBCUTANEOUS
  Administered 2024-01-24 – 2024-01-25 (×2): 2 [IU] via SUBCUTANEOUS
  Filled 2024-01-23: qty 2
  Filled 2024-01-23: qty 4
  Filled 2024-01-23: qty 2
  Filled 2024-01-23 (×2): qty 4
  Filled 2024-01-23 (×3): qty 2

## 2024-01-23 MED ORDER — CHLORHEXIDINE GLUCONATE CLOTH 2 % EX PADS
6.0000 | MEDICATED_PAD | Freq: Every day | CUTANEOUS | Status: DC
  Administered 2024-01-23 – 2024-01-24 (×2): 6 via TOPICAL

## 2024-01-23 MED ORDER — FENTANYL BOLUS VIA INFUSION
25.0000 ug | INTRAVENOUS | Status: DC | PRN
  Administered 2024-01-23: 100 ug via INTRAVENOUS
  Administered 2024-01-24: 50 ug via INTRAVENOUS

## 2024-01-23 MED ORDER — POLYETHYLENE GLYCOL 3350 17 G PO PACK
17.0000 g | PACK | Freq: Every day | ORAL | Status: DC
  Administered 2024-01-24 (×2): 17 g
  Filled 2024-01-23 (×2): qty 1

## 2024-01-23 MED ORDER — FENTANYL 2500MCG IN NS 250ML (10MCG/ML) PREMIX INFUSION: 0.0000 ug/h | INTRAVENOUS | Status: DC

## 2024-01-23 MED ORDER — PIPERACILLIN-TAZOBACTAM 3.375 G IVPB 30 MIN
3.3750 g | Freq: Once | INTRAVENOUS | Status: AC
  Administered 2024-01-23: 3.375 g via INTRAVENOUS
  Filled 2024-01-23: qty 50

## 2024-01-23 MED ORDER — MIDAZOLAM HCL 2 MG/2ML IJ SOLN
INTRAMUSCULAR | Status: AC
  Filled 2024-01-23: qty 2

## 2024-01-23 MED ORDER — CLOPIDOGREL BISULFATE 75 MG PO TABS
75.0000 mg | ORAL_TABLET | Freq: Every day | ORAL | Status: DC
  Administered 2024-01-24 (×2): 75 mg
  Filled 2024-01-23 (×2): qty 1

## 2024-01-23 MED ORDER — FENTANYL CITRATE (PF) 50 MCG/ML IJ SOSY
PREFILLED_SYRINGE | INTRAMUSCULAR | Status: AC
  Administered 2024-01-23: 50 ug via INTRAVENOUS
  Filled 2024-01-23: qty 2

## 2024-01-23 MED ORDER — FENTANYL 2500MCG IN NS 250ML (10MCG/ML) PREMIX INFUSION
INTRAVENOUS | Status: AC
  Administered 2024-01-23: 25 ug/h via INTRAVENOUS
  Filled 2024-01-23: qty 250

## 2024-01-23 MED ORDER — FOLIC ACID 1 MG PO TABS
1.0000 mg | ORAL_TABLET | Freq: Every day | ORAL | Status: DC
  Administered 2024-01-24 (×2): 1 mg
  Filled 2024-01-23 (×2): qty 1

## 2024-01-23 MED ORDER — FENTANYL CITRATE (PF) 50 MCG/ML IJ SOSY: 25.0000 ug | PREFILLED_SYRINGE | Freq: Once | INTRAMUSCULAR | Status: DC

## 2024-01-23 MED ORDER — VANCOMYCIN HCL 1500 MG/300ML IV SOLN: 1500.0000 mg | Freq: Once | INTRAVENOUS | Status: DC

## 2024-01-23 MED ORDER — HYDRALAZINE HCL 20 MG/ML IJ SOLN
10.0000 mg | Freq: Four times a day (QID) | INTRAMUSCULAR | Status: DC | PRN
  Administered 2024-01-24: 10 mg via INTRAVENOUS
  Filled 2024-01-23: qty 1

## 2024-01-23 MED ORDER — ROCURONIUM BROMIDE 10 MG/ML (PF) SYRINGE
PREFILLED_SYRINGE | INTRAVENOUS | Status: AC
  Administered 2024-01-23: 50 mg via INTRAVENOUS
  Filled 2024-01-23: qty 10

## 2024-01-23 MED ORDER — ETOMIDATE 2 MG/ML IV SOLN
INTRAVENOUS | Status: AC
  Administered 2024-01-23: 20 mg via INTRAVENOUS
  Filled 2024-01-23: qty 20

## 2024-01-23 MED ORDER — ORAL CARE MOUTH RINSE
15.0000 mL | OROMUCOSAL | Status: DC
  Administered 2024-01-23 – 2024-01-25 (×17): 15 mL via OROMUCOSAL

## 2024-01-23 MED ORDER — ORAL CARE MOUTH RINSE: 15.0000 mL | OROMUCOSAL | Status: DC | PRN

## 2024-01-23 MED ORDER — VANCOMYCIN HCL 1500 MG/300ML IV SOLN
1500.0000 mg | INTRAVENOUS | Status: DC
  Administered 2024-01-23: 1500 mg via INTRAVENOUS
  Filled 2024-01-23 (×2): qty 300

## 2024-01-23 MED ORDER — PROPOFOL 1000 MG/100ML IV EMUL
INTRAVENOUS | Status: AC
  Administered 2024-01-23: 5 ug/kg/min via INTRAVENOUS
  Filled 2024-01-23: qty 100

## 2024-01-23 MED ORDER — LEVETIRACETAM (KEPPRA) 500 MG/5 ML ADULT IV PUSH
500.0000 mg | Freq: Two times a day (BID) | INTRAVENOUS | Status: DC
  Administered 2024-01-24 (×2): 500 mg via INTRAVENOUS
  Filled 2024-01-23 (×2): qty 5

## 2024-01-23 MED ORDER — FAMOTIDINE 20 MG PO TABS
20.0000 mg | ORAL_TABLET | Freq: Two times a day (BID) | ORAL | Status: DC
  Administered 2024-01-24 (×3): 20 mg
  Filled 2024-01-23 (×3): qty 1

## 2024-01-23 MED ORDER — ROCURONIUM BROMIDE 10 MG/ML (PF) SYRINGE: 50.0000 mg | PREFILLED_SYRINGE | Freq: Once | INTRAVENOUS | Status: AC

## 2024-01-23 MED ORDER — IPRATROPIUM-ALBUTEROL 0.5-2.5 (3) MG/3ML IN SOLN
3.0000 mL | Freq: Four times a day (QID) | RESPIRATORY_TRACT | Status: DC
  Administered 2024-01-23 – 2024-01-24 (×2): 3 mL via RESPIRATORY_TRACT
  Filled 2024-01-23 (×3): qty 3

## 2024-01-23 NOTE — Progress Notes (Addendum)
 STROKE TEAM PROGRESS NOTE   INTERIM HISTORY/SUBJECTIVE No family at the bedside.  Patient laying in bed in no apparent distress.  Neurological exam remains stable and unchanged.  Labs and vitals are stable    CBC    Component Value Date/Time   WBC 9.6 01/23/2024 0831   RBC 5.28 01/23/2024 0831   HGB 17.5 (H) 01/23/2024 0831   HCT 50.5 01/23/2024 0831   PLT 243 01/23/2024 0831   MCV 95.6 01/23/2024 0831   MCH 33.1 01/23/2024 0831   MCHC 34.7 01/23/2024 0831   RDW 13.5 01/23/2024 0831    BMET    Component Value Date/Time   NA 138 01/23/2024 0831   K 4.3 01/23/2024 0831   CL 103 01/23/2024 0831   CO2 22 01/23/2024 0831   GLUCOSE 153 (H) 01/23/2024 0831   BUN 17 01/23/2024 0831   CREATININE 1.12 01/23/2024 0831   CALCIUM  9.6 01/23/2024 0831   GFRNONAA >60 01/23/2024 0831    IMAGING past 24 hours No results found.  Vitals:   01/23/24 0312 01/23/24 0500 01/23/24 0746 01/23/24 1138  BP: (!) 166/71  (!) 147/96 (!) 154/78  Pulse: 85  95 94  Resp: 17  19 20   Temp: 98 F (36.7 C)  97.9 F (36.6 C) 97.8 F (36.6 C)  TempSrc:   Oral Oral  SpO2: 97%  97% 96%  Weight:  67.3 kg    Height:         PHYSICAL EXAM General: Lethargic, well-developed patient in no acute distress CV: Regular rate and rhythm on monitor Respiratory:  Regular, unlabored respirations on room air GI: Abdomen soft and nontender   NEURO:  Eyes open, global aphasia and not able to answer questions.  Intermittently following simple commands or pantomime except closed eyes on command. Not able to name or repeat. Eyes left gaze preference but cross midline, R gaze incomplete, R visual neglect. R facial droop. Tongue protrusion not cooperative. LUE and LLE seems to be 5/5, RUE and RLE mild withdraw to pain. Sensation, coordination and gait not tested     ASSESSMENT/PLAN Mr. JOHNN KRASOWSKI is a 63 y.o. male with history of  hypertension, tobacco abuse, chronic low back pain due to old back injury at  work, presenting for evaluation of right-sided weakness and inability to talk as well as right-sided facial drooping which was noticed by family today.  Last known well when he came back from work Friday and has since not been spoken to.   NIH on Admission 13   Stroke:  left MCA territory infarcts, etiology: Likely large vessel disease MRI  Acute left MCA territory infarcts.  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 Reconstitution of the left paraclinoid ICA and proximal left M1 MCA with abrupt occlusion of the left mid M1 MCA. Poor distal left MCA opacification/collaterals. 2D Echo EF 45 to 50% LDL 133 HgbA1c 5.6 UDS + THC VTE prophylaxis -Lovenox  aspirin  81 mg daily prior to admission, now on ASA 81. Holding off plavix for now given possible PEG placement Therapy recommendations:  SNF Disposition: Pending   Hypertension Home meds: Amlodipine  5 mg, losartan  100 mg (not sure about compliance) Stable on the high end Avoid low BP Now on amlodipine  5->10 and losartan  50->100 Gradually lower BP to goal in 2-3 days Long term BP goal 130-150 given L ICA and MCA occlusion   Hyperlipidemia Home meds: None LDL 133, goal < 70 Add atorvastatin  80  Continue statin at discharge  Tobacco Abuse ETOH abuse  Patient smokes a lot per son, and has many beers during the day  On nicotine  patch Smoking cessation and alcohol  limitation education will be provided CIWA protocol Thiamine  and folate and multivitamin daily DT and seizure precaution   THC Abuse Patient uses THC intermittently per family UDS + THC TOC consult for cessation placed   Dysphagia Patient has post-stroke dysphagia, SLP consulted Currently on dysphagia 1 and honey thick liquid S/p cortrak placement On TF @ 55    Other Stroke Risk Factors Congestive heart failure, EF 45-50%   Other Active Problems AKI, creatinine 1.4-1.26--1.12--1.04, off IVF on TF  Hospital day # 5   Karna Geralds DNP, ACNPC-AG  [facility]  ATTENDING NOTE: I reviewed above note and agree with the assessment and plan. Pt was seen and examined.   No family at the bedside. Pt lying in bed, eyes open, tracking on both side, but left gaze preference, right neglect and hemiplegia. Moving spontaneously on the left, not following commands except closed eyes on request. On TF, still has gurgling sound in the throat which has not changed from yesterday. Mild tachypnea but not labored. On ASA and statin. PT and OT recommend SNF  For detailed assessment and plan, please refer to above as I have made changes wherever appropriate.   Ary Cummins, MD PhD Stroke Neurology 01/23/2024 10:48 PM     To contact Stroke Continuity provider, please refer to [email or web address]. After hours, contact General Neurology

## 2024-01-23 NOTE — Consult Note (Signed)
 NAME:  SEKAI GITLIN, MRN:  [id number], DOB:  02-Jul-1960, LOS: 5 ADMISSION DATE:  01/18/2024, CONSULTATION DATE:  01/23/2024 REFERRING MD: Sonjia, MD, CHIEF COMPLAINT:  hypoxia after vomiting @ 7:30 pm    History of Present Illness:  A 63 y.o. male patient with HTN, dyslipidemia, tobacco smoking, alcohol abuse, chronic low back pain due to old back injury at work, and medication noncompliance, who presented with ischemic stroke with sided right facial droop, right hemiplegia, and left gaze. Today he was cleared to have puree diet by ST. Dobbhoff was not removed. He vomited this pm with aspiration, hypoxia, and worsening mental status.  Rapid response was called and PCCM was consulted for intubation and transfer to ICU. He has tachycardia 110 sinus with ST elevation in lead 5 (new), ST depression in lead 2 (old), hypertensive, paradoxic breathing, NRM 15 L SpO2 97%.  Pertinent  Medical History  As above   Significant Hospital Events: Including procedures, antibiotic start and stop dates in addition to other pertinent events   01/18/2024: Admitted  11/29: rapid response after N/V and aspiration, transferred to ICU  Interim History / Subjective:    Objective    Blood pressure (!) 154/96, pulse 97, temperature 99.1 F (37.3 C), temperature source Oral, resp. rate 19, height 5' 10 (1.778 m), weight 67.3 kg, SpO2 96%.    Vent Mode: PRVC FiO2 (%):  [100 %] 100 % Set Rate:  [15 bmp] 15 bmp Vt Set:  [640 mL] 640 mL PEEP:  [5 cmH20] 5 cmH20 Plateau Pressure:  [15 cmH20] 15 cmH20   Intake/Output Summary (Last 24 hours) at 01/23/2024 2020 Last data filed at 01/23/2024 1800 Gross per 24 hour  Intake 1424 ml  Output 1600 ml  Net -176 ml   Filed Weights   01/21/24 0100 01/22/24 0500 01/23/24 0500  Weight: 69.7 kg 68.9 kg 67.3 kg    Examination: General: lethargic. Not stable. In resp distress  HENT: PERL, normal pharynx and oral mucosa. No LNE or thyromegaly. No JVD.  Lungs:  symmetrical air entry bilaterally. Diffuse crackles. No wheezing Cardiovascular: NL S1/S2. No m/g/r Abdomen: no distension or tenderness. Periumbilical hernia  Extremities: no edema. Symmetrical  Neuro: does not follow commands. Closed eyes. Eyes left gaze preference but cross midline, right gaze incomplete, right facial droop. Withdraws to pain in all limbs, but left >right. Right sided weakness.   Resolved problem list   Assessment and Plan  Acute hypoxic resp failure due to aspiration PNA ICU admission Vanco and Zosyn Duoneb Cx VAP and DAP protocols HOB elevation ABG PRVC 15/640/+5/100% Serial LA  ST changes, recent Echo EF 45-50%  EKG Serial Trop  Stroke:  left MCA territory infarcts, large vessel disease UDS + THC Aspirin  81 mg daily prior to admission, add Plavix.   HTN Continue same meds: Amlodipine  5 mg, losartan  100 mg Long term BP goal 130-150 given left ICA and MCA occlusion   Dyslipidemia Home meds: None LDL 133, goal < 70 Continue Atorvastatin  80   Tobacco Abuse ETOH abuse  Smoking cessation and alcohol limitation education will be provided when appropriate  Thiamine , folate, and multivitamin daily DT and seizure precaution Outpatient PFT   Dysphagia Post-stroke dysphagia, SLP consulted: was placed on  dysphagia 1 and honey thick liquid Resume TF  Labs   CBC: Recent Labs  Lab 01/18/24 1922 01/18/24 1948 01/21/24 0113 01/23/24 0831  WBC 8.9  --  8.4 9.6  HGB 16.2 15.6 16.7 17.5*  HCT 47.7 46.0 47.6  50.5  MCV 96.4  --  93.7 95.6  PLT 240  --  242 243    Basic Metabolic Panel: Recent Labs  Lab 01/19/24 2051 01/20/24 0108 01/20/24 1416 01/21/24 0113 01/22/24 0120 01/23/24 0831  NA 136 137  --  136 139 138  K 3.9 3.7  --  3.3* 4.0 4.3  CL 101 104  --  105 107 103  CO2 17* 19*  --  20* 22 22  GLUCOSE 92 88  --  152* 148* 153*  BUN 11 13  --  18 16 17   CREATININE 1.18 1.12  --  1.04 1.05 1.12  CALCIUM  8.5* 8.7*  --  8.6* 9.0 9.6  MG  2.1  --  2.2 2.2 2.3 2.4  PHOS  --   --  2.9 2.7 4.0 4.9*   GFR: Estimated Creatinine Clearance: 63.4 mL/min (by C-G formula based on SCr of 1.12 mg/dL). Recent Labs  Lab 01/18/24 1922 01/21/24 0113 01/23/24 0831  WBC 8.9 8.4 9.6    Liver Function Tests: Recent Labs  Lab 01/19/24 0225  AST 26  ALT 21  ALKPHOS 63  BILITOT 3.3*  PROT 6.6  ALBUMIN 3.5   No results for input(s): LIPASE, AMYLASE in the last 168 hours. No results for input(s): AMMONIA in the last 168 hours.  ABG    Component Value Date/Time   TCO2 22 01/18/2024 1948     Coagulation Profile: No results for input(s): INR, PROTIME in the last 168 hours.  Cardiac Enzymes: Recent Labs  Lab 01/18/24 1922  CKTOTAL 309    HbA1C: Hgb A1c MFr Bld  Date/Time Value Ref Range Status  01/19/2024 02:25 AM 5.6 4.8 - 5.6 % Final    Comment:    (NOTE)         Prediabetes: 5.7 - 6.4         Diabetes: >6.4         Glycemic control for adults with diabetes: <7.0     CBG: Recent Labs  Lab 01/23/24 0311 01/23/24 0748 01/23/24 1139 01/23/24 1519 01/23/24 1921  GLUCAP 149* 139* 151* 133* 159*    Review of Systems:   NA  Past Medical History:  He,  has a past medical history of Abnormal stress test, DDD (degenerative disc disease), Hypertension, and Tobacco abuse.   Surgical History:   Past Surgical History:  Procedure Laterality Date   LUMBAR FUSION       Social History:   reports that he has been smoking. He does not have any smokeless tobacco history on file. He reports current alcohol use.   Family History:  His family history is not on file.   Allergies No Known Allergies   Home Medications  Prior to Admission medications   Medication Sig Start Date End Date Taking? Authorizing Provider  amLODipine  (NORVASC ) 5 MG tablet Take 1 tablet (5 mg total) by mouth daily. 01/07/22 04/07/22  Emil Share, DO  aspirin  81 MG chewable tablet Chew 81 mg by mouth every evening.    [provider]  losartan  (COZAAR ) 100 MG tablet Take 1 tablet (100 mg total) by mouth daily. 01/07/22 04/07/22  Emil Share, DO  meloxicam  (MOBIC ) 7.5 MG tablet Take 1 tablet (7.5 mg total) by mouth daily. 02/20/16   Rumalda Fleeting, PA-C     Critical care time: 50 min    Mancel Ply, MD [facility] Pager: see AMION

## 2024-01-23 NOTE — Progress Notes (Signed)
 Pharmacy Antibiotic Note  Edward Phelps is a 63 y.o. male admitted on 01/18/2024 with CVA now c/b respiratory failure and pneumonia. Pharmacy has been consulted for vancomycin  dosing.  Plan: Vancomycin  1500mg  IV q24h - est AUC 505 Zosyn  3.375g IV EI q8h   Height: 5' 10 (177.8 cm) Weight: 67.3 kg (148 lb 5.9 oz) IBW/kg (Calculated) : 73  Temp (24hrs), Avg:98.1 F (36.7 C), Min:97.8 F (36.6 C), Max:99.1 F (37.3 C)  Recent Labs  Lab 01/18/24 1922 01/18/24 1948 01/19/24 2051 01/20/24 0108 01/21/24 0113 01/22/24 0120 01/23/24 0831  WBC 8.9  --   --   --  8.4  --  9.6  CREATININE 1.48*   < > 1.18 1.12 1.04 1.05 1.12   < > = values in this interval not displayed.    Estimated Creatinine Clearance: 63.4 mL/min (by C-G formula based on SCr of 1.12 mg/dL).    No Known Allergies    Edward Phelps, PharmD, BCPS, [facility] Clinical Pharmacist [phone] Please check AMION for all [facility] Pharmacy numbers 01/23/2024

## 2024-01-23 NOTE — Progress Notes (Addendum)
 RNs entered the room to do handoff. Found patient to be unresponsive. Patient placed on NRB mask and Zoll. RRT/RT paged to bedside. See RRT/RT notes. Dr. Alfornia notified.

## 2024-01-23 NOTE — Procedures (Addendum)
 Intubation Procedure Note  DOMINIE BENEDICK  [id number]  Jul 04, 1960  Date:01/23/24  Time:8:06 PM   Provider Performing:Alizeh Madril CHRISTELLA Ply    Procedure: Intubation (31500)  Indication(s) Respiratory Failure  Consent Unable to obtain consent due to emergent nature of procedure.   Anesthesia Etomidate, Fentanyl, and Rocuronium   Time Out Verified patient identification, verified procedure, site/side was marked, verified correct patient position, special equipment/implants available, medications/allergies/relevant history reviewed, required imaging and test results available.   Sterile Technique Usual hand hygeine, masks, and gloves were used   Procedure Description Patient positioned in bed supine.  Sedation given as noted above.  Patient was intubated with endotracheal tube using Glidescope.  View was Grade 1 full glottis .  Number of attempts was 1.  Colorimetric CO2 detector was consistent with tracheal placement.  ETT 7.5 @ 23 cm at the teeth  Complications/Tolerance None; patient tolerated the procedure well. Chest X-ray is ordered to verify placement.   EBL Minimal   Specimen(s) None

## 2024-01-23 NOTE — Progress Notes (Signed)
   01/23/24 1955  Spiritual Encounters  Type of Visit Initial  Care provided to: Family  Reason for visit Code  OnCall Visit Yes   Chaplain responded to Rapid Response page. Chaplain provided spiritual care and emotional support to Pt's son [place]) and son's girlfriend Kai) while medical team worked on Patient who was in distress. Chaplain took Pt's loved ones to 2H where Patient was being moved for further medical treatment. Chaplain remains available upon request.   Chaplain Missey Hasley

## 2024-01-23 NOTE — Progress Notes (Addendum)
 eLink Physician-Brief Progress Note Patient Name: Edward Phelps DOB: 1960/05/10 MRN: [id number]   Date of Service  01/23/2024  HPI/Events of Note  eICU Brief new admit note: 63 y.o. male  with hx of HTN, dyslipidemia, tobacco smoking, alcohol  abuse, chronic low back pain admitted on 01/18/2024 with CVA now c/b respiratory failure and pneumonia.  on Vent now.  ( Rapid response: from floor for :  His breathing is congested/tachypneic/labored. Pt will only withdraw to painful stimulation. He will not follow commands or speak. Gaze is fixed to the R and he does not blink to threat on either side. Pupils 2, equal, reactive. His R arm postures to pain. Lungs rhonchi t/o. Skin diaphoretic ), ground team Dr Mancel intubated him.   Camera: In synchrony with Vent 640( <8 ml/ibw)/5/15/100%. Getting ABG. VS stable. 97% sats. SBP 180.  Discussed with bed side . No intervention needed at this time.on fenta, propofol   Data: Reviewed 7.41/40/384 from 20:52. CxR: reviewed. ET in place. Increased broncho vascular congestion, could be aspiration pneumonitis developing no effusion or obvious air space consolidation. KUB OG in place.   EF 45%.  Hg 17 Potassium 4.5 Cr 1.14   eICU Interventions   -Wean VT and fio2 as tolerated.  -could have developing aspiration pneumonitis.  On antibiotics. - SAT/SBT weaning daily in AM - BP control - planning for possible PEG placement per Neuro notes - THC abuse. CIWA protocol in floor.  - follow troponin, LA - ordered procalcitonin, pro BNP.    Jodelle Hutching, MD [phone]      Intervention Category Major Interventions: Other:;Respiratory failure - evaluation and management Evaluation Type: New Patient Evaluation  Jodelle ONEIDA Hutching 01/23/2024, 8:48 PM  21:41 Neurologist Dr Voncile called and placed him on keppra , EEG in AM. Continue current care plan.  Hydralazine  10 mg IV  for SBP > 180 prn  Trend troponin and LA, being elevated post above  situation  Discussed with RN.   [phone] Follow up labs are improving, elevated pro BNP.  Continue care.

## 2024-01-23 NOTE — Plan of Care (Signed)

## 2024-01-23 NOTE — Progress Notes (Addendum)
 NEUROLOGY CONSULT FOLLOW UP NOTE   Date of service: January 23, 2024 Patient Name: Edward Phelps MRN:  [id number] DOB:  [birth year]  Interval Hx/subjective  Neurology service was recalled to evaluate the patient for concern for seizure. Briefly, 63 year old admitted for evaluation and management of a large left MCA stroke, presented outside the window for intervention.  Likely pending SNF placement after PEG placement.  He was on the neurology floors, when this evening, noted to have seizure and became tachypneic and labored breathing with congested breath sounds, he at that time also had right gaze preference or deviation.  He was not responsive to verbal stimulation.  Unable to protect airway, and hence emergently intubated and transferred to the ICU. Family at bedside Patient unable to provide any history at this time  Vitals   Vitals:   01/23/24 2000 01/23/24 2008 01/23/24 2021 01/23/24 2119  BP:      Pulse:    72  Resp:    15  Temp: 98.6 F (37 C)   100 F (37.8 C)  TempSrc: Oral     SpO2:  96% 96% 94%  Weight:      Height:         Body mass index is 21.29 kg/m.  Physical Exam  General: Sedated intubated HEENT: Normocephalic/atraumatic Lungs: Vented Cardiovascular: Regular rhythm Neurological exam Sedated intubated On minimal amount of propofol No spontaneous movements Pupils are equal round reactive No response to voice Gaze appears midline with mild left eye exotropia No gaze preference or deviation noted at this time To noxious stimulation, minimal grimacing but no withdrawal at all.  Medications  Current Facility-Administered Medications:    acetaminophen  (TYLENOL ) tablet 650 mg, 650 mg, Oral, Q4H PRN **OR** acetaminophen  (TYLENOL ) 160 MG/5ML solution 650 mg, 650 mg, Per Tube, Q4H PRN **OR** acetaminophen  (TYLENOL ) suppository 650 mg, 650 mg, Rectal, Q4H PRN, Lou Claretta HERO, MD   albuterol  (PROVENTIL ) (2.5 MG/3ML) 0.083% nebulizer solution 2.5 mg,  2.5 mg, Nebulization, Q6H PRN, Pokhrel, Laxman, MD, 2.5 mg at 01/22/24 0510   amLODipine  (NORVASC ) tablet 10 mg, 10 mg, Oral, Daily, Jerri Pfeiffer, MD, 10 mg at 01/23/24 0825   aspirin  chewable tablet 81 mg, 81 mg, Per Tube, Daily, Jerri Pfeiffer, MD, 81 mg at 01/23/24 9173   atorvastatin  (LIPITOR ) tablet 80 mg, 80 mg, Per Tube, Daily, Jerri Pfeiffer, MD, 80 mg at 01/23/24 9173   Chlorhexidine Gluconate Cloth 2 % PADS 6 each, 6 each, Topical, Daily, Pokhrel, Laxman, MD, 6 each at 01/23/24 2127   clopidogrel (PLAVIX) tablet 75 mg, 75 mg, Per Tube, Daily, Albustami, Omar M, MD   docusate (COLACE) 50 MG/5ML liquid 100 mg, 100 mg, Per Tube, BID, Albustami, Omar M, MD   enoxaparin  (LOVENOX ) injection 40 mg, 40 mg, Subcutaneous, Q24H, Lou, Claretta HERO, MD, 40 mg at 01/23/24 0843   famotidine (PEPCID) tablet 20 mg, 20 mg, Per Tube, BID, Albustami, Omar M, MD   feeding supplement (OSMOLITE 1.5 CAL) liquid 1,000 mL, 1,000 mL, Per Tube, Continuous, Pokhrel, Laxman, MD, Last Rate: 55 mL/hr at 01/23/24 1626, Infusion Verify at 01/23/24 1626   feeding supplement (PROSource TF20) liquid 60 mL, 60 mL, Per Tube, Daily, Pokhrel, Laxman, MD, 60 mL at 01/23/24 0826   fentaNYL (SUBLIMAZE) bolus via infusion 25-100 mcg, 25-100 mcg, Intravenous, Q15 min PRN, Albustami, Omar M, MD   fentaNYL (SUBLIMAZE) injection 25-50 mcg, 25-50 mcg, Intravenous, Once, Albustami, Mancel HERO, MD   fentaNYL (SUBLIMAZE) injection 50 mcg, 50 mcg, Intravenous, Q15 min PRN,  Dub Mancel HERO, MD   fentaNYL (SUBLIMAZE) injection 50-200 mcg, 50-200 mcg, Intravenous, Q30 min PRN, Albustami, Omar M, MD   fentaNYL in NS 250mL (60mcg/ml) infusion-PREMIX, 0-400 mcg/hr, Intravenous, Continuous, Albustami, Mancel HERO, MD, Last Rate: 2.5 mL/hr at 01/23/24 2008, 25 mcg/hr at 01/23/24 2008   folic acid (FOLVITE) tablet 1 mg, 1 mg, Per Tube, Daily, Albustami, Mancel HERO, MD   guaiFENesin -dextromethorphan (ROBITUSSIN DM) 100-10 MG/5ML syrup 5 mL, 5 mL, Oral, Q4H  PRN, Pokhrel, Laxman, MD, 5 mL at 01/23/24 0826   hydrALAZINE  (APRESOLINE ) injection 10 mg, 10 mg, Intravenous, Q6H PRN, Lou Claretta HERO, MD, 10 mg at 01/22/24 2350   insulin aspart (novoLOG) injection 2-6 Units, 2-6 Units, Subcutaneous, Q4H, Albustami, Omar M, MD   ipratropium-albuterol  (DUONEB) 0.5-2.5 (3) MG/3ML nebulizer solution 3 mL, 3 mL, Nebulization, QID, Albustami, Mancel HERO, MD, 3 mL at 01/23/24 2021   lactated ringers infusion, , Intravenous, Continuous, Albustami, Mancel HERO, MD   levETIRAcetam (KEPPRA) undiluted injection 2,500 mg, 2,500 mg, Intravenous, STAT, Kaili Castille, MD   NOREEN ON 01/24/2024] levETIRAcetam (KEPPRA) undiluted injection 500 mg, 500 mg, Intravenous, Q12H, Charrise Lardner, MD   lidocaine  (LIDODERM ) 5 % 1 patch, 1 patch, Transdermal, Q24H, Lou Claretta HERO, MD, 1 patch at 01/23/24 9177   losartan  (COZAAR ) tablet 100 mg, 100 mg, Oral, Daily, Jerri Pfeiffer, MD, 100 mg at 01/23/24 0825   multivitamin with minerals tablet 1 tablet, 1 tablet, Per Tube, Daily, Albustami, Mancel HERO, MD   Oral care mouth rinse, 15 mL, Mouth Rinse, Q2H, Pokhrel, Laxman, MD   Oral care mouth rinse, 15 mL, Mouth Rinse, PRN, Pokhrel, Laxman, MD   piperacillin-tazobactam (ZOSYN) IVPB 3.375 g, 3.375 g, Intravenous, Once, Cyndy Ozell DASEN, RPH   [START ON 01/24/2024] piperacillin-tazobactam (ZOSYN) IVPB 3.375 g, 3.375 g, Intravenous, Q8H, Cyndy Ozell DASEN, RPH   pneumococcal 20-valent conjugate vaccine (PREVNAR 20 ) injection 0.5 mL, 0.5 mL, Intramuscular, Tomorrow-1000, Lama, Sabas RAMAN, MD   polyethylene glycol (MIRALAX / GLYCOLAX) packet 17 g, 17 g, Per Tube, Daily, Albustami, Mancel HERO, MD   propofol (DIPRIVAN) 1000 MG/100ML infusion, 0-80 mcg/kg/min, Intravenous, Continuous, Albustami, Mancel HERO, MD, Last Rate: 2.02 mL/hr at 01/23/24 2010, 5 mcg/kg/min at 01/23/24 2010   senna-docusate (Senokot-S) tablet 1 tablet, 1 tablet, Oral, QHS PRN, Lou Claretta HERO, MD, 1 tablet at 01/22/24 9488   thiamine   (VITAMIN B1) tablet 100 mg, 100 mg, Per Tube, Daily, Pokhrel, Laxman, MD, 100 mg at 01/23/24 0825   vancomycin (VANCOREADY) IVPB 1500 mg/300 mL, 1,500 mg, Intravenous, Q24H, Cyndy Ozell DASEN [facility]  Labs and Diagnostic Imaging   CBC:  Recent Labs  Lab 01/21/24 0113 01/23/24 0831 01/23/24 2052  WBC 8.4 9.6  --   HGB 16.7 17.5* 17.0  HCT 47.6 50.5 50.0  MCV 93.7 95.6  --   PLT 242 243  --     Basic Metabolic Panel:  Lab Results  Component Value Date   NA 140 01/23/2024   K 4.5 01/23/2024   CO2 22 01/23/2024   GLUCOSE 153 (H) 01/23/2024   BUN 17 01/23/2024   CREATININE 1.12 01/23/2024   CALCIUM  9.6 01/23/2024   GFRNONAA >60 01/23/2024   GFRAA >60 02/20/2016   Lipid Panel:  Lab Results  Component Value Date   LDLCALC 133 (H) 01/19/2024   HgbA1c:  Lab Results  Component Value Date   HGBA1C 5.6 01/19/2024   Urine Drug Screen:     Component Value Date/Time   LABOPIA NONE DETECTED 01/19/2024 1803  COCAINSCRNUR NONE DETECTED 01/19/2024 1803   LABBENZ NONE DETECTED 01/19/2024 1803   AMPHETMU NONE DETECTED 01/19/2024 1803   THCU POSITIVE (A) 01/19/2024 1803   LABBARB NONE DETECTED 01/19/2024 1803   CT Head without contrast(Personally reviewed): MRI brain from 01/18/2024 with acute left MCA territory infarcts, no midline shift.  Possible abnormal left ICA flow void.  CT angio Head and Neck with contrast(Personally reviewed): Occluded left ICA origin.  Reconstitution of left paraclinoid ICA and proximal left M1 MCA with abrupt occlusion of the left mid M1 MCA.  Poor distal left MCA collaterals.  Repeat CT head today: CT head demonstrates the known left MCA territory infarct.  No evidence of bleed. Assessment   Edward Phelps is a 63 y.o. male past history of hypertension, tobacco use, chronic low back pain presenting for evaluation of right-sided weakness, aphasia and right facial droop-outside the window for intervention, noted to have left MCA territory infarct due  to left ICA occlusion at the origin. Was being evaluated for possible PEG placement.  Had an aspiration event.  Upon rapid response review of the patient, had a rightward gaze, and was unresponsive.  Emergently intubated and admitted to the ICU I suspect either a seizure leading to aspiration or aspiration lowering seizure threshold in a patient with strokes leading to seizure. Clinically appears to be stabilized now  Impression: Question new onset seizure in the setting of aspiration in a patient with known left MCA stroke due to left ICA occlusion.  Recommendations  Stat CT head ordered and obtained.  Personally reviewed.  No unexpected findings. Loaded with Keppra 2500 mg IV x 1 Keppra 500 twice daily started Maintain seizure precautions EEG in the morning Plan discussed with Dr. Rober, PCCM E-link MD ______________________________________________________________________   Signed, Eligio Lav, MD [facility]   CRITICAL CARE ATTESTATION Performed by: Eligio Lav, MD Total critical care time: 40 minutes Critical care time was exclusive of separately billable procedures and treating other patients and/or supervising APPs/Residents/Students Critical care was necessary to treat or prevent imminent or life-threatening deterioration. This patient is critically ill and at significant risk for neurological worsening and/or death and care requires constant monitoring. Critical care was time spent personally by me on the following activities: development of treatment plan with patient and/or surrogate as well as nursing, discussions with consultants, evaluation of patient's response to treatment, examination of patient, obtaining history from patient or surrogate, ordering and performing treatments and interventions, ordering and review of laboratory studies, ordering and review of radiographic studies, pulse oximetry, re-evaluation of patient's condition, participation in  multidisciplinary rounds and medical decision making of high complexity in the care of this patient.

## 2024-01-23 NOTE — Significant Event (Addendum)
 Rapid Response Event Note   Reason for Call :  Respiratory distress.  Per RN, pt was found with decreased MS and in respiratory distress.  Initial Focused Assessment:  Pt lying in bed with eyes closed. His breathing is congested/tachypneic/labored. Pt will only withdraw to painful stimulation. He will not follow commands or speak. Gaze is fixed to the R and he does not blink to threat on either side. Pupils 2, equal, reactive. His R arm postures to pain. Lungs rhonchi t/o. Skin diaphoretic.  HR-100s, BP-169/94, RR-40s, SpO2-97% on NRB.  Pt NTS'd x 3 with thick tan secretions suctioned out.-this did not change his level of distress.   TRH and PCCM to bedside. Pt transported urgently to 2H15 and intubated.  Interventions:  CBG-159 NRB NTS PCCM consulted: Tx to ICU for intubation Plan of Care:  Tx to 2H15.   Event Summary:   MD Notified: Dr. Alfornia notified and came to beside, PCCM consutled and Dr. Dub came to bedside.  Call Time:1910 via page Arrival Time:1915 End Time:2015  Tish Graeme Piety, RN

## 2024-01-23 NOTE — Progress Notes (Signed)
 PROGRESS NOTE  Edward Phelps FMW:[id number] DOB: Nov 28, 1960 DOA: 01/18/2024 PCP: Medicine, [facility]   LOS: 5 days   Brief narrative:  63 y.o. male with medical history significant for hypertension, tobacco use disorder, chronic low back pain, DDD, and medication noncompliance presented to hospital with right-sided weakness facial droop and aphasia.  MRI brain showed acute left MCA territory infarcts but no midline shift. 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 Neurology was consulted and patient was admitted to hospital for further evaluation and treatment.  At this time, patient has profound dysphagia and is on cortrak tube tube.  Will need CIR on discharge.   Assessment/Plan: Principal Problem:   Acute ischemic left MCA stroke (HCC) Active Problems:   Malnutrition of moderate degree  Acute left MCA infarct MRI  brain with left MCA infarct. 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  Patient was outside the window for thrombolytic treatment and was started on aspirin .  Neurology on board.  2D echocardiogram with LV ejection fraction of 45 to 50% with regional wall motion abnormality and indeterminate diastolic dysfunction.  No evidence of interatrial shunt.  Hemoglobin A1c of 5.6,  LDL of 133.  Speech therapy recommended dysphagia 1 diet at this time.  Also on cortrak tube tube feeding. Plavix on hold for possibility of needing PEG tube placement if no progress with oral.  Continue Lipitor  80 mg daily.  Cough, oral secretion  likely secondary to severe dysphagia and secretions.  History of smoking and possible COPD.  Continue albuterol  nebulizer, suction throat as needed.  Aspiration precautions.  Sounds better.  Nutrition.  Status post cortrak tube tube placement and tube feeding.  Dietitian on board.  Monitor electrolytes while on tube feeding.  Continue to follow speech therapy recommendations.  Has been started on pured diet.    Essential hypertension Blood pressure was uncontrolled on presentation secondary to noncompliance to medication.  Continue amlodipine  and losartan .  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  At this time creatinine has improved to 1.1.     History of degenerative disc disease Continue Tylenol  and Lidoderm  patch.   Tobacco use disorder Continue nicotine  patch  DVT prophylaxis: enoxaparin  (LOVENOX ) injection 40 mg Start: 01/19/24 1000   Disposition: Likely to CIR as per PT evaluation  Status is: Inpatient  Remains inpatient appropriate because: Pending clinical improvement, need for rehabilitation, cortrak tube tube feeding,    Code Status:     Code Status: Full Code  Family Communication: Spoke with the patient's son 01/22/2024.  Consultants: Neurology  Procedures: Cortrak tube tube placement  Anti-infectives:  None  Anti-infectives (From admission, onward)    None       Subjective: Today, patient was seen and examined at bedside.  Patient still has expressive aphasia but follows commands.  Denies pain.  Objective: Vitals:   01/23/24 0312 01/23/24 0746  BP: (!) 166/71 (!) 147/96  Pulse: 85 95  Resp: 17 19  Temp: 98 F (36.7 C) 97.9 F (36.6 C)  SpO2: 97% 97%    Intake/Output Summary (Last 24 hours) at 01/23/2024 0954 Last data filed at 01/23/2024 0800 Gross per 24 hour  Intake 1379.42 ml  Output 900 ml  Net 479.42 ml   Filed Weights   01/21/24 0100 01/22/24 0500 01/23/24 0500  Weight: 69.7 kg 68.9 kg 67.3 kg   Body mass index is 21.29 kg/m.  Physical Exam: GENERAL: Patient is alert awake and follows commands, expressive aphasia, not in obvious distress.  Cortrak tube tube in place HENT: No scleral pallor or icterus. Pupils equally reactive to light. Oral mucosa is moist NECK: is supple, no gross swelling noted. CHEST: Diminished breath sounds bilaterally.   CVS: S1 and S2  heard, no murmur. Regular rate and rhythm.  ABDOMEN: Soft, non-tender, bowel sounds are present.  External urinary catheter in place. EXTREMITIES: No edema.  Right hemiplegia with neglect. CNS right facial weakness. right-sided flaccid hemiplegia, expressive aphasia SKIN: warm and dry without rashes.  Data Review: I have personally reviewed the following laboratory data and studies,  CBC: Recent Labs  Lab 01/18/24 1922 01/18/24 1948 01/21/24 0113 01/23/24 0831  WBC 8.9  --  8.4 9.6  HGB 16.2 15.6 16.7 17.5*  HCT 47.7 46.0 47.6 50.5  MCV 96.4  --  93.7 95.6  PLT 240  --  242 243   Basic Metabolic Panel: Recent Labs  Lab 01/19/24 2051 01/20/24 0108 01/20/24 1416 01/21/24 0113 01/22/24 0120 01/23/24 0831  NA 136 137  --  136 139 138  K 3.9 3.7  --  3.3* 4.0 4.3  CL 101 104  --  105 107 103  CO2 17* 19*  --  20* 22 22  GLUCOSE 92 88  --  152* 148* 153*  BUN 11 13  --  18 16 17   CREATININE 1.18 1.12  --  1.04 1.05 1.12  CALCIUM  8.5* 8.7*  --  8.6* 9.0 9.6  MG 2.1  --  2.2 2.2 2.3 2.4  PHOS  --   --  2.9 2.7 4.0 4.9*   Liver Function Tests: Recent Labs  Lab 01/19/24 0225  AST 26  ALT 21  ALKPHOS 63  BILITOT 3.3*  PROT 6.6  ALBUMIN 3.5   No results for input(s): LIPASE, AMYLASE in the last 168 hours. No results for input(s): AMMONIA in the last 168 hours. Cardiac Enzymes: Recent Labs  Lab 01/18/24 1922  CKTOTAL 309   BNP (last 3 results) No results for input(s): BNP in the last 8760 hours.  ProBNP (last 3 results) No results for input(s): PROBNP in the last 8760 hours.  CBG: Recent Labs  Lab 01/22/24 1524 01/22/24 2001 01/22/24 2315 01/23/24 0311 01/23/24 0748  GLUCAP 140* 149* 144* 149* 139*   No results found for this or any previous visit (from the past 240 hours).   Studies: No results found.     Vernal Alstrom, MD  [facility] 01/23/2024  If 7PM-7AM, please contact night-coverage

## 2024-01-23 NOTE — TOC CAGE-AID Note (Signed)
 Transition of Care [facility]) - CAGE-AID Screening   Patient Details  Name: Edward Phelps MRN: [id number] Date of Birth: 1960/12/21  Transition of Care [facility]) CM/SW Contact:    Inocente GORMAN Kindle, LCSW Phone Number: 01/23/2024, 9:12 AM   Clinical Narrative: Patient is disoriented and unable to participate in screening at this time.    CAGE-AID Screening: Substance Abuse Screening unable to be completed due to: : Patient unable to participate

## 2024-01-23 NOTE — Plan of Care (Signed)
  Problem: Ischemic Stroke/TIA Tissue Perfusion: Goal: Complications of ischemic stroke/TIA will be minimized Outcome: Progressing   Problem: Clinical Measurements: Goal: Ability to maintain clinical measurements within normal limits will improve Outcome: Progressing Goal: Will remain free from infection Outcome: Progressing   Problem: Nutrition: Goal: Adequate nutrition will be maintained Outcome: Progressing   Problem: Education: Goal: Knowledge of disease or condition will improve Outcome: Not Progressing Goal: Knowledge of secondary prevention will improve (MUST DOCUMENT ALL) Outcome: Not Progressing Goal: Knowledge of patient specific risk factors will improve (DELETE if not current risk factor) Outcome: Not Progressing   Problem: Health Behavior/Discharge Planning: Goal: Ability to manage health-related needs will improve Outcome: Not Progressing

## 2024-01-23 NOTE — Progress Notes (Signed)
 Pt transported from 2H to CT and back without complications.

## 2024-01-23 NOTE — Progress Notes (Signed)
 Upon entering room with day nurse (Mejii RN) to complete handoff report, PT was noticed to be agonal breathing with emesis exiting mouth.  Immediately suctioned orally, continuous pulse oximetry placed along with repeat blood pressures.  Back board and zoll pads placed.  Charge nurses notified immediately and RRT placed.  PT only responsive to pain with right side gaze preference, PERLA, corneal absent on right and impaired on left.  After evaluation PT taken to ICU.

## 2024-01-24 ENCOUNTER — Inpatient Hospital Stay (HOSPITAL_COMMUNITY): Payer: MEDICAID

## 2024-01-24 DIAGNOSIS — R569 Unspecified convulsions: Secondary | ICD-10-CM

## 2024-01-24 DIAGNOSIS — G9389 Other specified disorders of brain: Secondary | ICD-10-CM

## 2024-01-24 DIAGNOSIS — R29733 NIHSS score 33: Secondary | ICD-10-CM

## 2024-01-24 LAB — BLOOD CULTURE ID PANEL (REFLEXED) - BCID2

## 2024-01-24 LAB — GLUCOSE, CAPILLARY
Glucose-Capillary: 122 mg/dL — ABNORMAL HIGH (ref 70–99)
Glucose-Capillary: 124 mg/dL — ABNORMAL HIGH (ref 70–99)
Glucose-Capillary: 146 mg/dL — ABNORMAL HIGH (ref 70–99)
Glucose-Capillary: 177 mg/dL — ABNORMAL HIGH (ref 70–99)
Glucose-Capillary: 184 mg/dL — ABNORMAL HIGH (ref 70–99)
Glucose-Capillary: 163 mg/dL — ABNORMAL HIGH (ref 70–99)

## 2024-01-24 LAB — CBC
HCT: 53.2 % — ABNORMAL HIGH (ref 39.0–52.0)
Hemoglobin: 17.9 g/dL — ABNORMAL HIGH (ref 13.0–17.0)
MCH: 33.3 pg (ref 26.0–34.0)
MCHC: 33.6 g/dL (ref 30.0–36.0)
MCV: 98.9 fL (ref 80.0–100.0)
Platelets: 248 K/uL (ref 150–400)
RBC: 5.38 MIL/uL (ref 4.22–5.81)
RDW: 13.9 % (ref 11.5–15.5)
WBC: 13.3 K/uL — ABNORMAL HIGH (ref 4.0–10.5)
nRBC: 0 % (ref 0.0–0.2)

## 2024-01-24 LAB — COMPREHENSIVE METABOLIC PANEL WITH GFR
ALT: 37 U/L (ref 0–44)
AST: 43 U/L — ABNORMAL HIGH (ref 15–41)
Albumin: 3.5 g/dL (ref 3.5–5.0)
Alkaline Phosphatase: 58 U/L (ref 38–126)
Anion gap: 14 (ref 5–15)
BUN: 29 mg/dL — ABNORMAL HIGH (ref 8–23)
CO2: 21 mmol/L — ABNORMAL LOW (ref 22–32)
Calcium: 9.4 mg/dL (ref 8.9–10.3)
Chloride: 104 mmol/L (ref 98–111)
Creatinine, Ser: 1.49 mg/dL — ABNORMAL HIGH (ref 0.61–1.24)
GFR, Estimated: 52 mL/min — ABNORMAL LOW (ref >60)
Glucose, Bld: 133 mg/dL — ABNORMAL HIGH (ref 70–99)
Potassium: 5 mmol/L (ref 3.5–5.1)
Sodium: 139 mmol/L (ref 135–145)
Total Bilirubin: 1.7 mg/dL — ABNORMAL HIGH (ref 0.0–1.2)
Total Protein: 7.5 g/dL (ref 6.5–8.1)

## 2024-01-24 LAB — PROCALCITONIN: Procalcitonin: <0.1 ng/mL

## 2024-01-24 LAB — TRIGLYCERIDES: Triglycerides: 105 mg/dL (ref <150)

## 2024-01-24 LAB — T4, FREE: Free T4: 1.03 ng/dL (ref 0.61–1.12)

## 2024-01-24 LAB — MRSA NEXT GEN BY PCR, NASAL: MRSA by PCR Next Gen: NOT DETECTED

## 2024-01-24 LAB — MAGNESIUM: Magnesium: 2.5 mg/dL — ABNORMAL HIGH (ref 1.7–2.4)

## 2024-01-24 LAB — LACTIC ACID, PLASMA: Lactic Acid, Venous: 1.8 mmol/L (ref 0.5–1.9)

## 2024-01-24 LAB — PHOSPHORUS: Phosphorus: 5.8 mg/dL — ABNORMAL HIGH (ref 2.5–4.6)

## 2024-01-24 LAB — TSH: TSH: 2.687 u[IU]/mL (ref 0.350–4.500)

## 2024-01-24 MED ORDER — IPRATROPIUM-ALBUTEROL 0.5-2.5 (3) MG/3ML IN SOLN
3.0000 mL | RESPIRATORY_TRACT | Status: DC | PRN
  Administered 2024-01-24: 3 mL via RESPIRATORY_TRACT
  Filled 2024-01-24: qty 3

## 2024-01-24 MED ORDER — PIVOT 1.5 CAL PO LIQD
1000.0000 mL | ORAL | Status: DC
  Administered 2024-01-24: 1000 mL

## 2024-01-24 MED ORDER — SODIUM CHLORIDE 0.9 % IV SOLN: INTRAVENOUS | Status: DC

## 2024-01-24 MED ORDER — VITAL HP 1.0 CAL PO LIQD
1000.0000 mL | ORAL | Status: DC
  Administered 2024-01-24: 1000 mL

## 2024-01-24 MED ORDER — ARFORMOTEROL TARTRATE 15 MCG/2ML IN NEBU
15.0000 ug | INHALATION_SOLUTION | Freq: Two times a day (BID) | RESPIRATORY_TRACT | Status: DC
  Administered 2024-01-24 – 2024-01-25 (×3): 15 ug via RESPIRATORY_TRACT
  Filled 2024-01-24 (×3): qty 2

## 2024-01-24 MED ORDER — REVEFENACIN 175 MCG/3ML IN SOLN
175.0000 ug | Freq: Every day | RESPIRATORY_TRACT | Status: DC
  Administered 2024-01-24 – 2024-01-25 (×2): 175 ug via RESPIRATORY_TRACT
  Filled 2024-01-24 (×2): qty 3

## 2024-01-24 MED ORDER — AMLODIPINE BESYLATE 10 MG PO TABS: 10.0000 mg | ORAL_TABLET | Freq: Every day | ORAL | Status: DC

## 2024-01-24 MED ORDER — METOCLOPRAMIDE HCL 5 MG/ML IJ SOLN
10.0000 mg | Freq: Three times a day (TID) | INTRAMUSCULAR | Status: DC
  Administered 2024-01-24 – 2024-01-25 (×3): 10 mg via INTRAVENOUS
  Filled 2024-01-24 (×3): qty 2

## 2024-01-24 MED ORDER — CLOPIDOGREL BISULFATE 75 MG PO TABS: 75.0000 mg | ORAL_TABLET | Freq: Every day | ORAL | Status: DC

## 2024-01-24 MED ORDER — SODIUM CHLORIDE 3 % IN NEBU
4.0000 mL | INHALATION_SOLUTION | Freq: Two times a day (BID) | RESPIRATORY_TRACT | Status: DC
  Administered 2024-01-24 – 2024-01-25 (×3): 4 mL via RESPIRATORY_TRACT
  Filled 2024-01-24 (×3): qty 4

## 2024-01-24 MED ORDER — BUDESONIDE 0.5 MG/2ML IN SUSP
0.5000 mg | Freq: Two times a day (BID) | RESPIRATORY_TRACT | Status: DC
  Administered 2024-01-24 – 2024-01-25 (×3): 0.5 mg via RESPIRATORY_TRACT
  Filled 2024-01-24 (×3): qty 2

## 2024-01-24 MED ORDER — VANCOMYCIN HCL 1250 MG/250ML IV SOLN
1250.0000 mg | INTRAVENOUS | Status: DC
  Administered 2024-01-24: 1250 mg via INTRAVENOUS
  Filled 2024-01-24: qty 250

## 2024-01-24 MED ORDER — LOSARTAN POTASSIUM 25 MG PO TABS: 100.0000 mg | ORAL_TABLET | Freq: Every day | ORAL | Status: DC

## 2024-01-24 NOTE — Progress Notes (Signed)
 01/24/2024 Family updated at bedside. Await MRI before further plans. Rolan Sharps MD PCCM

## 2024-01-24 NOTE — Procedures (Signed)
 History: 63 yo M with left MCA stroke, and concern for seizure overnight.   EEG Duration: 22 minutes  Sedation: Propofol and fentanyl  Patient State: asleep  Technique: This EEG was acquired with electrodes placed according to the International 10-20 electrode system (including Fp1, Fp2, F3, F4, C3, C4, P3, P4, O1, O2, T3, T4, T5, T6, A1, A2, Fz, Cz, Pz). The following electrodes were missing or displaced: none.   Background: The background consists of diffuse smoothly contoured theta range activity with some admixed delta. There is no definite posterior dominant rhythm and no change with stimulation.   Photic stimulation: Physiologic driving is not performed  EEG Abnormalities: 1) Generalized slow activity 2) Absent PDR   Clinical Interpretation: This normal EEG is consistent with a generalized non-specific cerebral dysfunction(encephalopathy) that could be related to sedation, among other possible causes. There was no seizure or seizure predisposition recorded on this study. Please note that lack of epileptiform activity on EEG does not preclude the possibility of epilepsy.   Aisha Seals, MD [facility]   If 7pm- 7am, please page neurology on call as listed in AMION.

## 2024-01-24 NOTE — Progress Notes (Signed)
 Pharmacy Antibiotic Note  Edward Phelps is a 63 y.o. male admitted on 01/18/2024 with CVA now c/b respiratory failure and pneumonia. Pharmacy has been consulted for vancomycin dosing.  Scr up today to 1.49 (CrCl 51 mL/min). WBC up to 13.3. Tmax 100.4.   Plan: Reduce vancomycin to 1250 mg IV q24h (estAUC 501, Vd 0.72) Continue Zosyn 3.375g IV EI q8h Monitor daily renal fx, cx results, clinical pic > follow up MRSA PCR for possible de-escalation from vanc    Height: 5' 10 (177.8 cm) Weight: 73.2 kg (161 lb 6 oz) IBW/kg (Calculated) : 73  Temp (24hrs), Avg:99.5 F (37.5 C), Min:98.6 F (37 C), Max:100.4 F (38 C)  Recent Labs  Lab 01/18/24 1922 01/18/24 1948 01/20/24 0108 01/21/24 0113 01/22/24 0120 01/23/24 0831 01/23/24 2035 01/23/24 2300 01/24/24 0228  WBC 8.9  --   --  8.4  --  9.6  --   --  13.3*  CREATININE 1.48*   < > 1.12 1.04 1.05 1.12  --   --  1.49*  LATICACIDVEN  --   --   --   --   --   --  2.3* 1.9 1.8   < > = values in this interval not displayed.    Estimated Creatinine Clearance: 51.7 mL/min (A) (by C-G formula based on SCr of 1.49 mg/dL (H)).    No Known Allergies   Thank you for allowing pharmacy to participate in this patient's care,  Suzen Sour, PharmD, BCCCP Clinical Pharmacist  Phone: [phone] 01/24/2024 3:00 PM  Please check AMION for all [facility] phone numbers After 10:00 PM, call Main Pharmacy [phone]

## 2024-01-24 NOTE — Progress Notes (Signed)
 EEG complete - results pending

## 2024-01-24 NOTE — Progress Notes (Addendum)
 NEUROLOGY PROGRESS  NOTE   Date of service: January 24, 2024 Patient Name: Edward Phelps MRN:  [id number] DOB:  01/22/1961 Chief Complaint: WORSENING MENTAL STATUS, CONCERN SEIZURES  Requesting Provider: Claudene Toribio BROCKS, MD  History of Present Illness  DARRIEL UTTER is a 63 y.o. male with hx of f hypertension, tobacco abuse, chronic low back pain due to old back injury at work, presenting for evaluation of right-sided weakness and inability to talk as well as right-sided facial drooping which was noticed by family prior to arrival.  Last known well when he came back from work Friday and has since not been spoken to.  Was checked on by friend today who noticed him sitting on the floor, with right facial droop and unable to talk normally.  Brought into the ED.  Not a candidate for code stroke activation due to being outside the window.  Imaging reveals left MCA territory infarct on the MRI.  CT angiography head and neck reveals left ICA occlusion at the origin and left M1 occlusion with poor left hemispheric collaterals. TTE with Ef 45-50%, no thrombus notable.   General neurology consulted 11/29 pm for right gaze preference, generalized motor activity, tachypnea concerning for seizures. Now intubated and sedated.   NIHSS components Score: Comment  1a Level of Conscious 0[]  1[]  2[]  3[x]      1b LOC Questions 0[]  1[]  2[x]       1c LOC Commands 0[]  1[]  2[x]       2 Best Gaze 0[]  1[x]  2[]     Slight left gaze preference of left eye, but dysconjugate   3 Visual 0[]  1[]  2[]  3[x]      4 Facial Palsy 0[]  1[x]  2[]  3[]    UTA, intubated , prev right   5a Motor Arm - left 0[]  1[]  2[]  3[]  4[x]  UN[]    5b Motor Arm - Right 0[]  1[]  2[]  3[]  4[x]  UN[]    6a Motor Leg - Left 0[]  1[]  2[]  3[]  4[x]  UN[]    6b Motor Leg - Right 0[]  1[]  2[]  3[]  4[x]  UN[]    7 Limb Ataxia 0[]  1[]  2[]  UN[x]      8 Sensory 0[x]  1[]  2[]  UN[]      9 Best Language 0[]  1[]  2[]  3[x]      10 Dysarthria 0[]  1[]  2[x]  UN[]      11 Extinct. and  Inattention 0[x]  1[]  2[]       TOTAL: 33     ROS   Unable to ascertain due to mental status.   Past History   Past Medical History:  Diagnosis Date   Abnormal stress test    Low risk but abnormal stress test   DDD (degenerative disc disease)    Hypertension    Tobacco abuse     Past Surgical History:  Procedure Laterality Date   LUMBAR FUSION      Family History: History reviewed. No pertinent family history.  Social History  reports that he has been smoking. He does not have any smokeless tobacco history on file. He reports current alcohol use. No history on file for drug use.  No Known Allergies  Medications   Current Facility-Administered Medications:    acetaminophen  (TYLENOL ) tablet 650 mg, 650 mg, Oral, Q4H PRN **OR** acetaminophen  (TYLENOL ) 160 MG/5ML solution 650 mg, 650 mg, Per Tube, Q4H PRN **OR** acetaminophen  (TYLENOL ) suppository 650 mg, 650 mg, Rectal, Q4H PRN, Lou, Claretta HERO, MD   albuterol  (PROVENTIL ) (2.5 MG/3ML) 0.083% nebulizer solution 2.5 mg, 2.5 mg, Nebulization, Q6H PRN, Pokhrel, Laxman, MD, 2.5 mg at  01/24/24 0323   amLODipine  (NORVASC ) tablet 10 mg, 10 mg, Oral, Daily, Jerri Pfeiffer, MD, 10 mg at 01/24/24 9077   aspirin  chewable tablet 81 mg, 81 mg, Per Tube, Daily, Jerri Pfeiffer, MD, 81 mg at 01/24/24 9077   atorvastatin  (LIPITOR ) tablet 80 mg, 80 mg, Per Tube, Daily, Jerri Pfeiffer, MD, 80 mg at 01/24/24 9078   Chlorhexidine Gluconate Cloth 2 % PADS 6 each, 6 each, Topical, Daily, Pokhrel, Laxman, MD, 6 each at 01/23/24 2127   clopidogrel (PLAVIX) tablet 75 mg, 75 mg, Per Tube, Daily, Albustami, Omar M, MD, 75 mg at 01/24/24 9077   docusate (COLACE) 50 MG/5ML liquid 100 mg, 100 mg, Per Tube, BID, Albustami, Omar M, MD, 100 mg at 01/24/24 9078   enoxaparin  (LOVENOX ) injection 40 mg, 40 mg, Subcutaneous, Q24H, Lou Claretta HERO, MD, 40 mg at 01/24/24 9078   famotidine (PEPCID) tablet 20 mg, 20 mg, Per Tube, BID, Albustami, Omar M, MD, 20 mg at  01/24/24 9077   feeding supplement (OSMOLITE 1.5 CAL) liquid 1,000 mL, 1,000 mL, Per Tube, Continuous, Pokhrel, Laxman, MD, Stopped at 01/23/24 2030   feeding supplement (PROSource TF20) liquid 60 mL, 60 mL, Per Tube, Daily, Pokhrel, Laxman, MD, 60 mL at 01/24/24 0920   fentaNYL (SUBLIMAZE) bolus via infusion 25-100 mcg, 25-100 mcg, Intravenous, Q15 min PRN, Albustami, Omar M, MD, 50 mcg at 01/24/24 0910   fentaNYL (SUBLIMAZE) injection 25-50 mcg, 25-50 mcg, Intravenous, Once, Albustami, Mancel HERO, MD   fentaNYL (SUBLIMAZE) injection 50 mcg, 50 mcg, Intravenous, Q15 min PRN, Albustami, Omar M, MD   fentaNYL (SUBLIMAZE) injection 50-200 mcg, 50-200 mcg, Intravenous, Q30 min PRN, Albustami, Omar M, MD   fentaNYL in NS (57mcg/ml) infusion-PREMIX, 0-400 mcg/hr, Intravenous, Continuous, Albustami, Mancel HERO, MD, Last Rate: 5 mL/hr at 01/24/24 0700, 50 mcg/hr at 01/24/24 0700   folic acid (FOLVITE) tablet 1 mg, 1 mg, Per Tube, Daily, Albustami, Omar M, MD, 1 mg at 01/24/24 9077   guaiFENesin -dextromethorphan (ROBITUSSIN DM) 100-10 MG/5ML syrup 5 mL, 5 mL, Oral, Q4H PRN, Pokhrel, Laxman, MD, 5 mL at 01/23/24 9173   hydrALAZINE  (APRESOLINE ) injection 10 mg, 10 mg, Intravenous, Q6H PRN, Mohan, Kinila T, MD   insulin aspart (novoLOG) injection 2-6 Units, 2-6 Units, Subcutaneous, Q4H, Albustami, Mancel HERO, MD, 2 Units at 01/24/24 9096   ipratropium-albuterol  (DUONEB) 0.5-2.5 (3) MG/3ML nebulizer solution 3 mL, 3 mL, Nebulization, QID, Albustami, Omar M, MD, 3 mL at 01/24/24 0900   lactated ringers infusion, , Intravenous, Continuous, Albustami, Mancel HERO, MD, Stopped at 01/24/24 0533   levETIRAcetam (KEPPRA) undiluted injection 500 mg, 500 mg, Intravenous, Q12H, Arora, Ashish, MD, 500 mg at 01/24/24 9078   lidocaine  (LIDODERM ) 5 % 1 patch, 1 patch, Transdermal, Q24H, Lou Claretta HERO, MD, 1 patch at 01/24/24 9081   losartan  (COZAAR ) tablet 100 mg, 100 mg, Oral, Daily, Jerri Pfeiffer, MD, 100 mg at 01/24/24  0921   multivitamin with minerals tablet 1 tablet, 1 tablet, Per Tube, Daily, Albustami, Mancel HERO, MD, 1 tablet at 01/24/24 9078   Oral care mouth rinse, 15 mL, Mouth Rinse, Q2H, Pokhrel, Laxman, MD, 15 mL at 01/24/24 9047   Oral care mouth rinse, 15 mL, Mouth Rinse, PRN, Pokhrel, Laxman, MD   piperacillin-tazobactam (ZOSYN) IVPB 3.375 g, 3.375 g, Intravenous, Q8H, Cyndy Ozell DASEN, RPH, Last Rate: 12.5 mL/hr at 01/24/24 0700, Infusion Verify at 01/24/24 0700   pneumococcal 20-valent conjugate vaccine (PREVNAR 20 ) injection 0.5 mL, 0.5 mL, Intramuscular, Tomorrow-1000, Drusilla, Sabas RAMAN, MD  polyethylene glycol (MIRALAX / GLYCOLAX) packet 17 g, 17 g, Per Tube, Daily, Albustami, Omar M, MD, 17 g at 01/24/24 0921   propofol (DIPRIVAN) 1000 MG/100ML infusion, 0-80 mcg/kg/min, Intravenous, Continuous, Albustami, Mancel HERO, MD, Last Rate: 2.02 mL/hr at 01/24/24 0700, 5 mcg/kg/min at 01/24/24 0700   senna-docusate (Senokot-S) tablet 1 tablet, 1 tablet, Oral, QHS PRN, Lou Claretta HERO, MD, 1 tablet at 01/22/24 0511   sodium chloride  HYPERTONIC 3 % nebulizer solution 4 mL, 4 mL, Nebulization, BID, Bowser, Grace E, NP   thiamine  (VITAMIN B1) tablet 100 mg, 100 mg, Per Tube, Daily, Pokhrel, Laxman, MD, 100 mg at 01/24/24 9077   vancomycin (VANCOREADY) IVPB 1500 mg/300 mL, 1,500 mg, Intravenous, Q24H, Cyndy Ozell DASEN, [facility], Stopped at 01/24/24 0113  Vitals   Vitals:   01/24/24 0815 01/24/24 0830 01/24/24 0845 01/24/24 0900  BP: (!) 178/73 (!) 161/81 (!) 154/86 (!) 163/57  Pulse: 71 66 63 72  Resp: 17 16 18 20   Temp: 98.8 F (37.1 C) 99 F (37.2 C) 99 F (37.2 C) 99 F (37.2 C)  TempSrc:      SpO2: 94% 94% 96% 95%  Weight:      Height:        Body mass index is 23.16 kg/m.   Physical Exam  *Paused sedation for examination *  General: Sedated + intubated HEENT: Normocephalic/atraumatic Lungs: Vented Cardiovascular: Regular rhythm Neurological exam No spontaneous movements No BTT  bilaterally  No to arousable to voice nor noxious stimuli  No corneal reflex, + gag reflex  Dysconjugate gaze both at baseline and with oculocephalic maneuver: left eye with left gaze preference and right slightly everted towards right. Does not bury sclera with right eye towards left nor with left eye towards right but does bury right eye towards right and left eye towards left  Right pupil miotic but reactive to light, left pupil miotic and foxed.  Extensor posturing of LUE to noxious Flexion posturing RUE to noxious W/d version flexion (but not triple flexion) BLLEs Hyper reflexive both Ues and Les , L>R  Labs/Imaging/Neurodiagnostic studies   CBC:  Recent Labs  Lab 2024/02/19 0831 2024/02/19 2052 01/24/24 0228  WBC 9.6  --  13.3*  HGB 17.5* 17.0 17.9*  HCT 50.5 50.0 53.2*  MCV 95.6  --  98.9  PLT 243  --  248   Basic Metabolic Panel:  Lab Results  Component Value Date   NA 139 01/24/2024   K 5.0 01/24/2024   CO2 21 (L) 01/24/2024   GLUCOSE 133 (H) 01/24/2024   BUN 29 (H) 01/24/2024   CREATININE 1.49 (H) 01/24/2024   CALCIUM  9.4 01/24/2024   GFRNONAA 52 (L) 01/24/2024   GFRAA >60 02/20/2016   Lipid Panel:  Lab Results  Component Value Date   LDLCALC 133 (H) 01/19/2024   HgbA1c:  Lab Results  Component Value Date   HGBA1C 5.6 01/19/2024   Urine Drug Screen:     Component Value Date/Time   LABOPIA NONE DETECTED 01/19/2024 1803   COCAINSCRNUR NONE DETECTED 01/19/2024 1803   LABBENZ NONE DETECTED 01/19/2024 1803   AMPHETMU NONE DETECTED 01/19/2024 1803   THCU POSITIVE (A) 01/19/2024 1803   LABBARB NONE DETECTED 01/19/2024 1803    Alcohol Level No results found for: ETH INR No results found for: INR APTT No results found for: APTT AED levels: No results found for: PHENYTOIN, ZONISAMIDE, LAMOTRIGINE, LEVETIRACETA  CT Head without contrast(Personally reviewed): CT angio Head and Neck with contrast(Personally reviewed): Left ICA occluded  at the  origin Left M1 occlusion proximally with abrupt left mid M1 occlusion.  Poor distal left MCA opacification.  Poor collaterals on the left hemisphere    MRI Brain(Personally reviewed):11/24 Acute left MCA territory infarct with no midline shift and abnormal left ICA flow void  Repeat CT head 11/29 pm 1. Evolving subacute left MCA distribution infarct, similar in size and distribution to previous MRI. No evidence for hemorrhagic transformation or significant regional mass effect. 2. No other new acute intracranial abnormality.  Neurodiagnostics rEEG: 11/30 1) Generalized slow activity 2) Absent Posterior Dominant Rhythm    ASSESSMENT   Edward Phelps is a 63 y.o. male w/ PMHx as above, admitted for LMCA stroke with left ICA occlusion with tandem left M1 occlusion out of window for TNK or MVT since 11/24. He initially presented with right sided weakness, speak changes, and right facial droop. On 11/29 PM, patient was noted to have seizure and became tachypneic and labored breathing with congested breath sounds, he at that time also had right gaze preference or deviation.   On subsequent examination 11/30 am, he is with evidence of brainstem lesion, with new exam finding, as above. Exam was likely obscured in setting of paralytics for intubation last evening. His level of arousal remains poor, even off sedation, and there is no PDR on his routine EEG.  He was for PEG tube insertion prior to this, but now with change in status, will defer this until collect more data on his clinical regression. Discussed with family.   RECOMMENDATIONS   -MRI brain, attention to brainstem -TEE can be considered should additional areas of ischemia be identified, to rule out cardioembolic source of multi territorial infarcts  -hold off on 24 hr EEG, no more Aed loading, but can stay on kep 500mg  bid maintenance dose for now  -continue both plavix 75mg  and ASA 81 mg for now for secondary stroke prevention   -continue atorvastatin  80mg  daily  -treatment for aspiration pneumonia and remainder of care via CCM -GOC convo ongoing   Advance Care Planning  Gave updates to son, Tavita Eastham, regarding new examination findings and the ways in which new lesions would change our prognosis. We decided to keep full code but will revisit GOC convo after MRI in hopes will glean more info.Answered all questions.    ______________________________________________________________________  Signed, Kristi Kehoe, PA-C [facility]  ATTENDING ATTESTATION:  Transferred to gender service for follow-up on EEG and seizure management.  EEG was negative for seizures.  Admitted for left ICA occlusion and left MCA stroke. Change in exam from yesterday now intubated.  CT shows left MCA evolving infarct.  Given significant worsening of exam recommend MRI to evaluate further to help with  prognosis.  Continue Keppra and DAPT therapy for now  Continue Keppra 500 twice daily.  Will continue to follow.  Dr. Nichola evaluated pt independently, reviewed imaging, chart, labs. Discussed and formulated plan with the Resident/APP. Changes were made to the note where appropriate. Please see APP/resident note above for details.     This patient is critically ill due to respiratory distress, stroke  and at significant risk of neurological worsening, death form heart failure, respiratory failure, recurrent stroke, bleeding from [facility], seizure, sepsis. This patient's care requires constant monitoring of vital signs, hemodynamics, respiratory and cardiac monitoring, review of multiple databases, neurological assessment, discussion with family, other specialists and medical decision making of high complexity. I spent 35 minutes of neurocritical care time in the care of this  patient.   Javayah Magaw,MD

## 2024-01-24 NOTE — Progress Notes (Signed)
 Nutrition Follow-up  DOCUMENTATION CODES:   Non-severe (moderate) malnutrition in context of social or environmental circumstances  INTERVENTION:   Pivot 1.5@55ml /hr- Initiate at 73ml/hr, once ok per MD, begin increasing by 10ml/hr q 8 hours until goal rate is reached.   Free water flushes 30ml q4 hours to maintain tube patency   Regimen provides 1980kcal/day, 124g/day protein and of free water.   Pt at high refeed risk; recommend monitor potassium, magnesium and phosphorus labs daily until stable  Daily weights   NUTRITION DIAGNOSIS:   Moderate Malnutrition related to social / environmental circumstances (prolonged inadequate energy intake) as evidenced by mild muscle depletion, mild fat depletion. -ongoing   GOAL:   Provide needs based on ASPEN/SCCM guidelines -not met   MONITOR:   Vent status, Labs, Weight trends, TF tolerance, Skin, I & O's  ASSESSMENT:   63 y/o male with PMH significant for HLD, etoh abuse, HTN, tobacco use disorder. chronic pain and DDD who is admitted with acute ischemic left MCA stroke complicated by dysphagia, aspiration and PNA.  RD working remotely.  Pt s/p cortrak tube placement 11/26  Pt initiated on an dysphagia 1/honey thick diet 11/28. Cortrak tube removed 11/29. Pt noted to be eating <25% of meals yesterday. Pt had an episode of vomiting with aspiration yesterday and was transferred to the ICU and ventilated. OGT currently in place with output overnight. Plan is to initiate trickle tube feeds today. Pt is at high refeed risk. No BM since admit; pt is receiving bowel regimen. Per chart, pt is up ~8lbs since admission. Pt +2.2L on his I & Os.   Medications reviewed and include: aspirin , plavix, colace, pepcid, lovenox , folic acid, insulin, reglan, MVI, miralax, thiamine , NaCl @75ml /hr, zosyn, vancomycin   Labs reviewed: K 5.0 wnl, BUN 29(H), creat 1.49(H), P 5.8(H), Mg 2.5(H) BNP 1050(H)- 11/29 Wbc- 13.3(H) Cbgs- 177, 146,  124 x 24 hrs  AIC 5.6- 11/25  Patient is currently intubated on ventilator support MV: 13.0 L/min Temp (24hrs), Avg:99.7 F (37.6 C), Min:98.6 F (37 C), Max:100.8 F (38.2 C)  MAP >48mmHg   UOP-   NUTRITION - FOCUSED PHYSICAL EXAM: Unable to perform at this time   Diet Order:   Diet Order     None      EDUCATION NEEDS:   No education needs have been identified at this time  Skin:  Skin Assessment: Reviewed RN Assessment  Last BM:  PTA  Height:   Ht Readings from Last 1 Encounters:  01/19/24 5' 10 (1.778 m)    Weight:   Wt Readings from Last 1 Encounters:  01/24/24 73.2 kg    Ideal Body Weight:  75.5 kg  BMI:  Body mass index is 23.16 kg/m.  Estimated Nutritional Needs:   Kcal:  2042kcal/day  Protein:  105-120g/day  Fluid:  2.1-2.4L/day  Augustin Shams MS, RD, LDN If unable to be reached, please send secure chat to RD inpatient available from 8:00a-4:00p daily

## 2024-01-24 NOTE — Plan of Care (Signed)
  Problem: Clinical Measurements: Goal: Respiratory complications will improve Outcome: Progressing   Problem: Clinical Measurements: Goal: Cardiovascular complication will be avoided Outcome: Progressing   Problem: Clinical Measurements: Goal: Diagnostic test results will improve Outcome: Progressing   Problem: Ischemic Stroke/TIA Tissue Perfusion: Goal: Complications of ischemic stroke/TIA will be minimized Outcome: Progressing

## 2024-01-24 NOTE — Progress Notes (Signed)
 PHARMACY - PHYSICIAN COMMUNICATION CRITICAL VALUE ALERT - BLOOD CULTURE IDENTIFICATION (BCID)  Edward Phelps is an 63 y.o. male who presented to [facility] on 01/18/2024 with a chief complaint of AIS  Assessment:  Blood culture results 1 of 4 bottles methicillin resistant staph epi. Likely contaminant and  Name of physician (or Provider) Contacted: Claudene  Current antibiotics: Vancomycin and Zosyn  Changes to prescribed antibiotics recommended:  covered by current Vanco/Zosyn. Would not change therapy based on this result   Results for orders placed or performed during the hospital encounter of 01/18/24  Blood Culture ID Panel (Reflexed) (Collected: 01/23/2024  8:53 PM)  Result Value Ref Range   Enterococcus faecalis NOT DETECTED NOT DETECTED   Enterococcus Faecium NOT DETECTED NOT DETECTED   Listeria monocytogenes NOT DETECTED NOT DETECTED   Staphylococcus species DETECTED (A) NOT DETECTED   Staphylococcus aureus (BCID) NOT DETECTED NOT DETECTED   Staphylococcus epidermidis DETECTED (A) NOT DETECTED   Staphylococcus lugdunensis NOT DETECTED NOT DETECTED   Streptococcus species NOT DETECTED NOT DETECTED   Streptococcus agalactiae NOT DETECTED NOT DETECTED   Streptococcus pneumoniae NOT DETECTED NOT DETECTED   Streptococcus pyogenes NOT DETECTED NOT DETECTED   A.calcoaceticus-baumannii NOT DETECTED NOT DETECTED   Bacteroides fragilis NOT DETECTED NOT DETECTED   Enterobacterales NOT DETECTED NOT DETECTED   Enterobacter cloacae complex NOT DETECTED NOT DETECTED   Escherichia coli NOT DETECTED NOT DETECTED   Klebsiella aerogenes NOT DETECTED NOT DETECTED   Klebsiella oxytoca NOT DETECTED NOT DETECTED   Klebsiella pneumoniae NOT DETECTED NOT DETECTED   Proteus species NOT DETECTED NOT DETECTED   Salmonella species NOT DETECTED NOT DETECTED   Serratia marcescens NOT DETECTED NOT DETECTED   Haemophilus influenzae NOT DETECTED NOT DETECTED   Neisseria meningitidis NOT DETECTED NOT  DETECTED   Pseudomonas aeruginosa NOT DETECTED NOT DETECTED   Stenotrophomonas maltophilia NOT DETECTED NOT DETECTED   Candida albicans NOT DETECTED NOT DETECTED   Candida auris NOT DETECTED NOT DETECTED   Candida glabrata NOT DETECTED NOT DETECTED   Candida krusei NOT DETECTED NOT DETECTED   Candida parapsilosis NOT DETECTED NOT DETECTED   Candida tropicalis NOT DETECTED NOT DETECTED   Cryptococcus neoformans/gattii NOT DETECTED NOT DETECTED   Methicillin resistance mecA/C DETECTED (A) NOT DETECTED    Larraine CHRISTELLA Brazier 01/24/2024  5:29 PM

## 2024-01-24 NOTE — Progress Notes (Signed)
 NAME:  Edward Phelps, MRN:  [id number], DOB:  Aug 07, 1960, LOS: 6 ADMISSION DATE:  01/18/2024, CONSULTATION DATE:  01/23/2024 REFERRING MD: Sonjia, MD, CHIEF COMPLAINT:  hypoxia after vomiting @ 7:30 pm    History of Present Illness:  A 63 y.o. male patient with HTN, dyslipidemia, tobacco smoking, alcohol abuse, chronic low back pain due to old back injury at work, and medication noncompliance, who presented with ischemic stroke with sided right facial droop, right hemiplegia, and left gaze. Today he was cleared to have puree diet by ST. Dobbhoff was not removed. He vomited this pm with aspiration, hypoxia, and worsening mental status.   Rapid response was called and PCCM was consulted for intubation and transfer to ICU. He has tachycardia 110 sinus with ST elevation in lead 5 (new), ST depression in lead 2 (old), hypertensive, paradoxic breathing, NRM 15 L SpO2 97%.   Pertinent  Medical History  As above   Significant Hospital Events: Including procedures, antibiotic start and stop dates in addition to other pertinent events   01/18/2024: Admitted L MCA infarct outside of intervention window  11/25-28 supportive care, considering PEG, SNF  11/29: rapid response after N/V and aspiration, transferred to ICU  Interim History / Subjective:  Txf to ICU last night and intubated Repeat CT H with expected evolving cytotoxic edema, no other acute abnormalities EEG w/o sz  Objective    Blood pressure (!) 154/65, pulse 80, temperature 99.7 F (37.6 C), resp. rate 19, height 5' 10 (1.778 m), weight 73.2 kg, SpO2 94%.    Vent Mode: PRVC FiO2 (%):  [40 %-100 %] 40 % Set Rate:  [15 bmp] 15 bmp Vt Set:  [640 mL] 640 mL PEEP:  [5 cmH20] 5 cmH20 Plateau Pressure:  [15 cmH20-18 cmH20] 18 cmH20   Intake/Output Summary (Last 24 hours) at 01/24/2024 1059 Last data filed at 01/24/2024 1001 Gross per 24 hour  Intake 1530.78 ml  Output 1580 ml  Net -49.22 ml   Filed Weights   01/22/24 0500  01/23/24 0500 01/24/24 0600  Weight: 68.9 kg 67.3 kg 73.2 kg    Examination: General: chronically and critically ill M  Neuro: with sedation paused - disconjugate gaze. + gag cough spontaneous respirations. Some extension to pain, does not follow commands HENT: NCAT ETT secure anicteric sclera Lungs: Bilat rhonchi + exp wheeze. Moderate thick pink tinged tan secretions   Cardiovascular: irregular  Abdomen: soft ndnt  Extremities: no obvious acute joint deformity  GU: foley     Resolved problem list   Assessment and Plan   Acute hypoxic resp failure  Aspiration PNA Dysphagia Tobacco use, possible underlying COPD  P -on vanc zosyn, narrow pending cx data -cont MV support -WUA/SBT  -- mechanics on PSV 5/5 are ok, mentation precludes extubation candidacy so put back to [facility]  -RASS goal 0/-1, wean sedation as able  -change duonebs to triple therapy  -add HTS and CPT   L MCA CVA -resultant aphasia, dysphagia, R sided defs  -no significant change on CT H overnight following neuro status change -spot eeg w/o sz -d/w neuro at bedside -- exam seems to have evolved from previous, concerned that there may be new insult  P -neuro following -Statin, ASA. Plavix started 11/29 night (was being held previously for likely PEG) -MRI  -keppra   AKI P -keep foley -follow renal indices uop   Elevated trop, suspect demand - downtrending  HFmrEF  Afib, rate controlled   HTN HLD  P -EKG ordered  last night, still pending. Would like to confirm new fib  -cont antihypertensives, change to per tube   Tobacco use THC use EtOH use P -cessation support if/when neurologically appropriate   GOC -sounds like family was considering PEG SNF.   -will cont discussions as his neuro picture becomes more clear.     Labs   CBC: Recent Labs  Lab 01/18/24 1922 01/18/24 1948 01/21/24 0113 01/23/24 0831 01/23/24 2052 01/24/24 0228  WBC 8.9  --  8.4 9.6  --  13.3*  HGB 16.2 15.6 16.7  17.5* 17.0 17.9*  HCT 47.7 46.0 47.6 50.5 50.0 53.2*  MCV 96.4  --  93.7 95.6  --  98.9  PLT 240  --  242 243  --  248    Basic Metabolic Panel: Recent Labs  Lab 01/20/24 0108 01/20/24 1416 01/21/24 0113 01/22/24 0120 01/23/24 0831 01/23/24 2052 01/24/24 0228  NA 137  --  136 139 138 140 139  K 3.7  --  3.3* 4.0 4.3 4.5 5.0  CL 104  --  105 107 103  --  104  CO2 19*  --  20* 22 22  --  21*  GLUCOSE 88  --  152* 148* 153*  --  133*  BUN 13  --  18 16 17   --  29*  CREATININE 1.12  --  1.04 1.05 1.12  --  1.49*  CALCIUM  8.7*  --  8.6* 9.0 9.6  --  9.4  MG  --  2.2 2.2 2.3 2.4  --  2.5*  PHOS  --  2.9 2.7 4.0 4.9*  --  5.8*   GFR: Estimated Creatinine Clearance: 51.7 mL/min (A) (by C-G formula based on SCr of 1.49 mg/dL (H)). Recent Labs  Lab 01/18/24 1922 01/21/24 0113 01/23/24 0831 01/23/24 2035 01/23/24 2300 01/24/24 0228  PROCALCITON  --   --   --   --  <0.10  --   WBC 8.9 8.4 9.6  --   --  13.3*  LATICACIDVEN  --   --   --  2.3* 1.9 1.8    Liver Function Tests: Recent Labs  Lab 01/19/24 0225 01/24/24 0228  AST 26 43*  ALT 21 37  ALKPHOS 63 58  BILITOT 3.3* 1.7*  PROT 6.6 7.5  ALBUMIN 3.5 3.5   No results for input(s): LIPASE, AMYLASE in the last 168 hours. No results for input(s): AMMONIA in the last 168 hours.  ABG    Component Value Date/Time   PHART 7.411 01/23/2024 2052   PCO2ART 40.5 01/23/2024 2052   PO2ART 384 (H) 01/23/2024 2052   HCO3 25.7 01/23/2024 2052   TCO2 27 01/23/2024 2052   O2SAT 100 01/23/2024 2052     Coagulation Profile: No results for input(s): INR, PROTIME in the last 168 hours.  Cardiac Enzymes: Recent Labs  Lab 01/18/24 1922  CKTOTAL 309    HbA1C: Hgb A1c MFr Bld  Date/Time Value Ref Range Status  01/19/2024 02:25 AM 5.6 4.8 - 5.6 % Final    Comment:    (NOTE)         Prediabetes: 5.7 - 6.4         Diabetes: >6.4         Glycemic control for adults with diabetes: <7.0     CBG: Recent Labs   Lab 01/23/24 1921 01/23/24 2023 01/23/24 2351 01/24/24 0314 01/24/24 0754  GLUCAP 159* 136* 150* 122* 124*    CRITICAL CARE Performed by: Ronnald FORBES Gave  Total critical care time: 40 minutes  Critical care time was exclusive of separately billable procedures and treating other patients. Critical care was necessary to treat or prevent imminent or life-threatening deterioration.  Critical care was time spent personally by me on the following activities: development of treatment plan with patient and/or surrogate as well as nursing, discussions with consultants, evaluation of patient's response to treatment, examination of patient, obtaining history from patient or surrogate, ordering and performing treatments and interventions, ordering and review of laboratory studies, ordering and review of radiographic studies, pulse oximetry and re-evaluation of patient's condition.  Ronnald Gave MSN, AGACNP-BC [place] [facility] Amion for pager  01/24/2024, 10:59 AM

## 2024-01-25 ENCOUNTER — Inpatient Hospital Stay (HOSPITAL_COMMUNITY): Payer: MEDICAID

## 2024-01-25 DIAGNOSIS — G935 Compression of brain: Secondary | ICD-10-CM

## 2024-01-25 DIAGNOSIS — Z9911 Dependence on respirator [ventilator] status: Secondary | ICD-10-CM

## 2024-01-25 DIAGNOSIS — J9601 Acute respiratory failure with hypoxia: Secondary | ICD-10-CM

## 2024-01-25 LAB — GLUCOSE, CAPILLARY
Glucose-Capillary: 139 mg/dL — ABNORMAL HIGH (ref 70–99)
Glucose-Capillary: 128 mg/dL — ABNORMAL HIGH (ref 70–99)

## 2024-01-25 LAB — BASIC METABOLIC PANEL WITH GFR
Anion gap: 12 (ref 5–15)
BUN: 38 mg/dL — ABNORMAL HIGH (ref 8–23)
CO2: 22 mmol/L (ref 22–32)
Calcium: 9.2 mg/dL (ref 8.9–10.3)
Chloride: 112 mmol/L — ABNORMAL HIGH (ref 98–111)
Creatinine, Ser: 1.65 mg/dL — ABNORMAL HIGH (ref 0.61–1.24)
GFR, Estimated: 46 mL/min — ABNORMAL LOW (ref >60)
Glucose, Bld: 149 mg/dL — ABNORMAL HIGH (ref 70–99)
Potassium: 4.1 mmol/L (ref 3.5–5.1)
Sodium: 146 mmol/L — ABNORMAL HIGH (ref 135–145)

## 2024-01-25 LAB — CULTURE, RESPIRATORY W GRAM STAIN

## 2024-01-25 LAB — PHOSPHORUS: Phosphorus: 4.1 mg/dL (ref 2.5–4.6)

## 2024-01-25 LAB — MAGNESIUM: Magnesium: 2.9 mg/dL — ABNORMAL HIGH (ref 1.7–2.4)

## 2024-01-25 MED ORDER — NOREPINEPHRINE 4 MG/250ML-% IV SOLN: 0.0000 ug/min | INTRAVENOUS | Status: DC

## 2024-01-25 MED ORDER — NOREPINEPHRINE 4 MG/250ML-% IV SOLN
0.0000 ug/min | INTRAVENOUS | Status: DC
  Administered 2024-01-25: 2 ug/min via INTRAVENOUS

## 2024-01-25 MED ORDER — NOREPINEPHRINE 4 MG/250ML-% IV SOLN
INTRAVENOUS | Status: AC
  Filled 2024-01-25: qty 250

## 2024-01-25 MED ORDER — POLYVINYL ALCOHOL 1.4 % OP SOLN: 1.0000 [drp] | Freq: Four times a day (QID) | OPHTHALMIC | Status: DC | PRN

## 2024-01-25 MED ORDER — GLYCOPYRROLATE 0.2 MG/ML IJ SOLN: 0.2000 mg | INTRAMUSCULAR | Status: DC | PRN

## 2024-01-25 MED ORDER — FENTANYL BOLUS VIA INFUSION
100.0000 ug | INTRAVENOUS | Status: DC | PRN
  Administered 2024-01-25: 100 ug via INTRAVENOUS

## 2024-01-25 MED ORDER — ACETAMINOPHEN 325 MG PO TABS: 650.0000 mg | ORAL_TABLET | Freq: Four times a day (QID) | ORAL | Status: DC | PRN

## 2024-01-25 MED ORDER — GLYCOPYRROLATE 0.2 MG/ML IJ SOLN
0.2000 mg | INTRAMUSCULAR | Status: DC | PRN
  Administered 2024-01-25: 0.2 mg via INTRAVENOUS
  Filled 2024-01-25: qty 1

## 2024-01-25 MED ORDER — GLYCOPYRROLATE 1 MG PO TABS: 1.0000 mg | ORAL_TABLET | ORAL | Status: DC | PRN

## 2024-01-25 MED ORDER — SODIUM CHLORIDE 0.9 % IV SOLN: 250.0000 mL | INTRAVENOUS | Status: DC

## 2024-01-25 MED ORDER — SODIUM CHLORIDE 0.9 % IV SOLN: INTRAVENOUS | Status: DC

## 2024-01-25 MED ORDER — ACETAMINOPHEN 650 MG RE SUPP: 650.0000 mg | Freq: Four times a day (QID) | RECTAL | Status: DC | PRN

## 2024-01-25 MED ORDER — FENTANYL 2500MCG IN NS 250ML (10MCG/ML) PREMIX INFUSION
0.0000 ug/h | INTRAVENOUS | Status: DC
  Administered 2024-01-25: 50 ug/h via INTRAVENOUS
  Filled 2024-01-25: qty 250

## 2024-01-25 MED ORDER — MIDAZOLAM HCL (PF) 2 MG/2ML IJ SOLN
2.0000 mg | INTRAMUSCULAR | Status: DC | PRN
  Administered 2024-01-25: 4 mg via INTRAVENOUS
  Filled 2024-01-25: qty 4

## 2024-01-25 MED ORDER — MORPHINE SULFATE (PF) 2 MG/ML IV SOLN: 2.0000 mg | INTRAVENOUS | Status: DC | PRN

## 2024-01-25 DEATH — deceased

## 2024-01-28 LAB — CULTURE, BLOOD (ROUTINE X 2)
Culture: NO GROWTH
Special Requests: ADEQUATE

## 2024-01-31 LAB — CULTURE, BLOOD (ROUTINE X 2): Special Requests: ADEQUATE

## 2024-02-25 NOTE — Plan of Care (Addendum)
 Overnight neurology note  MRI of the brain has been completed.  Personally reviewed after the RN called me after reviewing the results.  It is a very abnormal scan now with right hemispheric infarction and hemorrhagic transformation involving frontal lobe, basal ganglia, occipital lobe.  The hemorrhagic transformation also involves the left side with the known strokes.  There is extensive cerebral swelling with complete effacement of the right lateral ventricle and midline shift right to left about 15 mm.  There is uncal herniation, compressing and displacing the midbrain with restricted diffusion throughout the midbrain and pons.  There is loss of internal carotid artery flow void on the left, which is to be expected knowing his CTA findings from when he came in. At this time, looking at his progress clinically, EEG findings with absent PDR and now these imaging findings with bihemispheric strokes with hemorrhagic transformation and extensive cerebral edema, this is likely going to be a nonsurvivable insult.  Hypertonic saline and neurosurgical consultation are futile at this time.  I will let the morning team as discussed with the family and possibly terminally extubate him for comfort measures.  Discussed with As[place]ion PCCM MD Dr. Gaylord Eligio Lav, MD Neurology

## 2024-02-25 NOTE — Progress Notes (Signed)
SLP Cancellation Note  Patient Details Name: ALANMICHAEL BARMORE MRN: [id number] DOB: 09/04/1960   Cancelled treatment:       Reason Eval/Treat Not Completed: Patient not medically ready. Pt now with plans for comfort care after new stroke finding. SLP will sign off   Donevan Biller, Consuelo Fitch 02/04/2024, 9:08 AM

## 2024-02-25 NOTE — Progress Notes (Signed)
 Transported patient to MRI while patient is on the mechanical ventilator. Patient remained stable during transport.

## 2024-02-25 NOTE — Discharge Summary (Signed)
Physician Discharge Summary         Patient ID: Edward Phelps MRN: [id number] DOB/AGE: 1960-08-30 64 y.o.  Admit date: 01/18/2024 Discharge date: 02/22/2024  Discharge Diagnoses:    Active Hospital Problems   Diagnosis Date Noted   Acute ischemic left MCA stroke (HCC) 01/18/2024   Uncal herniation (HCC) 02/24/2024   On mechanically assisted ventilation (HCC) 02/03/2024   Acute hypoxic respiratory failure (HCC) 02/22/2024   Malnutrition of moderate degree 01/20/2024    Resolved Hospital Problems  No resolved problems to display.      Discharge summary   HPI per PCCM consult note 11/29:   A 64 y.o. male patient with HTN, dyslipidemia, tobacco smoking, alcohol abuse, chronic low back pain due to old back injury at work, and medication noncompliance, who presented with ischemic stroke with sided right facial droop, right hemiplegia, and left gaze. Today he was cleared to have puree diet by ST. Dobbhoff was not removed. He vomited this pm with aspiration, hypoxia, and worsening mental status.  Rapid response was called and PCCM was consulted for intubation and transfer to ICU. He has tachycardia 110 sinus with ST elevation in lead 5 (new), ST depression in lead 2 (old), hypertensive, paradoxic breathing, NRM 15 L SpO2 97%.  Significant hospital events: 01/18/2024: Admitted L MCA infarct outside of intervention window  11/25-28 supportive care, considering PEG, SNF  11/29: rapid response after N/V and aspiration, transferred to ICU 11/30-12/1: MRI brain overnight with bilateral cerebral infarction, hemorrhagic conversion, extensive cerebral edema with uncal herniation and restricted diffusion throughout the midbrain and pons. Worsening hemodynamics and neuro exam concerning for progression of herniation. Discussion with family made DNR/comfort care and terminally extubated  Discharge Plan by Active Problems    Bilateral hemispheric CVA with hemorrhagic  transformation Cerebral edema Uncal herniation L MCA CVA Acute hypoxic resp failure  Aspiration PNA Dysphagia Tobacco use, possible underlying COPD  AKI Elevated trop, suspect demand ischemia HFmrEF  Afib, rate controlled   HTN HLD  Tobacco use THC use EtOH use MRI obtained overnight 11/30-12/1 given worsening neuro status which demonstrated bilateral cerebral infarction, hemorrhagic conversion, extensive cerebral edema with complete effacement of the right lateral ventricle and 15 mm MLS, uncal herniation and restricted diffusion throughout the midbrain and pons. Per neurology likely non-survivable. Patient developed acute hypertension followed by hypotension the morning of 12/1, levophed started. Likely progression of herniation, family called and changed code status to DNR. Family arrived at bedside and transitioned to comfort care. Comfort care orders placed and patient terminally extubated. Time of death 12:02 pm.  Significant Hospital tests/ studies   MRI brain wo 12/1 IMPRESSION: 1. Extensive bilateral cerebral infarction with hemorrhagic oozing in the right frontal lobe, basal ganglia, right temporal and occipital lobes, and medially within the bilateral parietal lobes. 2. Extensive cerebral swelling with complete effacement of the right lateral ventricle and midline shift to the left by approximately 15 mm. 3. Uncal herniation compressing and displacing the midbrain with restricted diffusion throughout the midbrain and pons. 4. Loss of the left internal carotid artery flow void.  [facility] 11/29 IMPRESSION: 1. Evolving subacute left MCA distribution infarct, similar in size and distribution to previous MRI. No evidence for hemorrhagic transformation or significant regional mass effect. 2. No other new acute intracranial abnormality.  Modified barium swallow study 11/26  Echo 11/24 IMPRESSIONS   1. Left ventricular ejection fraction, by estimation, is 45 to 50%. The   left ventricle has mildly decreased function. The left ventricle  demonstrates regional wall motion abnormalities (see scoring  diagram/findings for description). Left ventricular  diastolic parameters are indeterminate.   2. Right ventricular systolic function is normal. The right ventricular  size is normal. Tricuspid regurgitation signal is inadequate for assessing  PA pressure.   3. The mitral valve is grossly normal. Trivial mitral valve  regurgitation. No evidence of mitral stenosis.   4. The aortic valve is grossly normal. Aortic valve regurgitation is not  visualized. No aortic stenosis is present.   5. The inferior vena cava is normal in size with greater than 50%  respiratory variability, suggesting right atrial pressure of 3 mmHg.   6. Agitated saline contrast bubble study was negative, with no evidence  of any interatrial shunt.   CTA head/neck 11/24 IMPRESSION: 1. 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 2. Reconstitution of the left paraclinoid ICA and proximal left M1 MCA with abrupt occlusion of the left mid M1 MCA. Poor distal left MCA opacification/collaterals.  MRI brain wo 11/24 IMPRESSION: 1. Acute left MCA territory infarcts.  No midline shift. 2. Possible abnormal left ICA flow void. Please see forthcoming ordered CTA head/neck.  CXR 11/24 IMPRESSION: No acute cardiopulmonary abnormality.  Procedures   11/26 Cortrak  Culture data/antimicrobials   MRSA nares 11/30 not detected Blood cultures 11/29 with 1/2 staph epi, likely contaminant  Tracheal aspirate 11/29 few WBC, few GPC, few GNR   Consults  PCCM Neurology SLP PT/OT [facility]  Discharge Exam: BP (!) 62/47   Pulse 97   Temp (!) 96.9 F (36.1 C) (Axillary)   Resp 15   Ht 5' 10 (1.778 m)   Wt 73.2 kg   SpO2 96%   BMI 23.16 kg/m    Labs at discharge   Lab Results  Component Value Date   CREATININE 1.65 (H) 02/24/2024   BUN 38 (H) 02/12/2024   NA 146 (H) 02/15/2024   K 4.1  02/23/2024   CL 112 (H) 02/11/2024   CO2 22 02/06/2024   Lab Results  Component Value Date   WBC 13.3 (H) 01/24/2024   HGB 17.9 (H) 01/24/2024   HCT 53.2 (H) 01/24/2024   MCV 98.9 01/24/2024   PLT 248 01/24/2024   Lab Results  Component Value Date   ALT 37 01/24/2024   AST 43 (H) 01/24/2024   ALKPHOS 58 01/24/2024   BILITOT 1.7 (H) 01/24/2024   No results found for: INR, PROTIME  Current radiological studies    MR BRAIN WO CONTRAST Result Date: 02/06/2024 EXAM: MRI BRAIN WITHOUT CONTRAST 02/17/2024 03:41:11 AM TECHNIQUE: Multiplanar multisequence MRI of the head/brain was performed without the administration of intravenous contrast. COMPARISON: MRI of the head dated 01/18/2024. CLINICAL HISTORY: suspect brainstem ischemia FINDINGS: BRAIN AND VENTRICLES: Extensive bilateral cerebral infarction is present with hemorrhagic oozing noted within the right frontal lobe, basal ganglia, right temporal and occipital lobes and medially within the bilateral parietal lobes. There is extensive cerebral swelling with complete effacement of the right lateral ventricle and shift of the midline structures to the left by approximately 15 mm. Uncal herniation is compressing the midbrain with restricted diffusion throughout the midbrain and pons. There is loss of the left internal carotid artery flow void. The sella is unremarkable. No mass. ORBITS: No acute abnormality. SINUSES AND MASTOIDS: No acute abnormality. BONES AND SOFT TISSUES: Normal marrow signal. No acute soft tissue abnormality. IMPRESSION: 1. Extensive bilateral cerebral infarction with hemorrhagic oozing in the right frontal lobe, basal ganglia, right temporal and occipital lobes, and medially within  the bilateral parietal lobes. 2. Extensive cerebral swelling with complete effacement of the right lateral ventricle and midline shift to the left by approximately 15 mm. 3. Uncal herniation compressing and displacing the midbrain with restricted  diffusion throughout the midbrain and pons. 4. Loss of the left internal carotid artery flow void. Electronically signed by: Evalene Coho MD 01/30/2024 04:16 AM EST RP Workstation: [id number]   CT HEAD WO CONTRAST ( ) Result Date: 01/24/2024 CLINICAL DATA:  Initial evaluation for new onset seizure. EXAM: CT HEAD WITHOUT CONTRAST TECHNIQUE: Contiguous axial images were obtained from the base of the skull through the vertex without intravenous contrast. RADIATION DOSE REDUCTION: This exam was performed according to the departmental dose-optimization program which includes automated exposure control, adjustment of the mA and/or kV according to patient size and/or use of iterative reconstruction technique. COMPARISON:  Comparison made with CT and MRI from 01/18/2024. FINDINGS: Brain: Evolving cytotoxic edema involving the left cerebral hemisphere, consistent with an evolving subacute left MCA distribution infarct. Overall, size and distribution is relatively similar to previous MRI. No evidence for hemorrhagic transformation. No significant regional mass effect or midline shift. No other new large vessel territory infarct. No acute intracranial hemorrhage. No mass lesion or midline shift. No hydrocephalus or extra-axial fluid collection. Underlying chronic microvascular ischemic disease noted. Vascular: No abnormal hyperdense vessel. Calcified atherosclerosis present at the skull base. Skull: Scalp soft tissues within normal limits.  Calvarium intact. Sinuses/Orbits: Globes orbital soft tissues within normal limits. Paranasal sinuses are largely clear. No significant mastoid effusion. Nasogastric tube in place. Other: None. IMPRESSION: 1. Evolving subacute left MCA distribution infarct, similar in size and distribution to previous MRI. No evidence for hemorrhagic transformation or significant regional mass effect. 2. No other new acute intracranial abnormality. Electronically Signed   By: Morene Hoard  M.D.   On: 01/24/2024 00:02   DG Abd Portable 1V Result Date: 01/23/2024 EXAM: 1 VIEW XRAY OF THE ABDOMEN 01/23/2024 08:12:00 PM COMPARISON: None available. CLINICAL HISTORY: Encounter for orogastric (OG) tube placement FINDINGS: LINES, TUBES AND DEVICES: Feeding tube tip projects over the proximal stomach. Enteric tube tip and side port in the stomach. BOWEL: Nonobstructive bowel gas pattern. SOFT TISSUES: No opaque urinary calculi. BONES: No acute osseous abnormality. IMPRESSION: 1. Satisfactory position of the enteric tubes. Electronically signed by: Norman Gatlin MD 01/23/2024 08:24 PM EST RP Workstation: [id number]   Portable Chest x-ray Result Date: 01/23/2024 EXAM: 1 VIEW(S) XRAY OF THE CHEST 01/23/2024 08:11:00 PM COMPARISON: 01/18/2024 CLINICAL HISTORY: Endotracheal tube present FINDINGS: LINES, TUBES AND DEVICES: Endotracheal tube in place with tip 5 cm above the carina. Enteric tube in place coursing below the hemidiaphragm with tip in expected gastric lumen. LUNGS AND PLEURA: No focal pulmonary opacity. No pleural effusion. No pneumothorax. HEART AND MEDIASTINUM: Aortic calcification. BONES AND SOFT TISSUES: No acute osseous abnormality. IMPRESSION: 1. Endotracheal tube and enteric tube in appropriate positions. Electronically signed by: Norman Gatlin MD 01/23/2024 08:22 PM EST RP Workstation: [id number]    Disposition:         Follow-up appointment   N/a  Discharge Condition:    Deceased   Signed:  Tamela Stakes, MD  Attending Physician, Critical Care Medicine [facility] [facility] See Amion for pager If no response to pager, please call [phone] until 7pm After 7pm, Please call E-link [phone]

## 2024-02-25 NOTE — Progress Notes (Signed)
NAME:  Edward Phelps, MRN:  [id number], DOB:  1960-05-28, LOS: 7 ADMISSION DATE:  01/18/2024, CONSULTATION DATE:  01/23/2024 REFERRING MD: Sonjia, MD, CHIEF COMPLAINT:  hypoxia after vomiting @ 7:30 pm    History of Present Illness:  A 64 y.o. male patient with HTN, dyslipidemia, tobacco smoking, alcohol abuse, chronic low back pain due to old back injury at work, and medication noncompliance, who presented with ischemic stroke with sided right facial droop, right hemiplegia, and left gaze. Today he was cleared to have puree diet by ST. Dobbhoff was not removed. He vomited this pm with aspiration, hypoxia, and worsening mental status.   Rapid response was called and PCCM was consulted for intubation and transfer to ICU. He has tachycardia 110 sinus with ST elevation in lead 5 (new), ST depression in lead 2 (old), hypertensive, paradoxic breathing, NRM 15 L SpO2 97%.   Pertinent  Medical History  As above   Significant Hospital Events: Including procedures, antibiotic start and stop dates in addition to other pertinent events   01/18/2024: Admitted L MCA infarct outside of intervention window  11/25-28 supportive care, considering PEG, SNF  11/29: rapid response after N/V and aspiration, transferred to ICU 11/30-12/1: MRI brain overnight with bilateral cerebral infarction, hemorrhagic conversion, extensive cerebral edema with uncal herniation and restricted diffusion throughout the midbrain and pons  Interim History / Subjective:  MRI brain overnight with bilateral cerebral infarction, hemorrhagic conversion, extensive cerebral edema with complete effacement of the right lateral ventricle and 15 mm MLS, uncal herniation and restricted diffusion throughout the midbrain and pons. Acute hypertension followed by hypotension, family called and decision made to transition to comfort care (see separate IPAL note)  Objective    Blood pressure (!) 62/47, pulse 97, temperature (!) 96.9 F (36.1  C), temperature source Axillary, resp. rate 15, height 5' 10 (1.778 m), weight 73.2 kg, SpO2 96%.    Vent Mode: PRVC FiO2 (%):  [40 %] 40 % Set Rate:  [15 bmp] 15 bmp Vt Set:  [640 mL] 640 mL PEEP:  [5 cmH20] 5 cmH20 Plateau Pressure:  [1 cmH20-15 cmH20] 15 cmH20   Intake/Output Summary (Last 24 hours) at 02/17/2024 1129 Last data filed at 02/05/2024 1100 Gross per 24 hour  Intake 2745.1 ml  Output 2425 ml  Net 320.1 ml   Filed Weights   01/22/24 0500 01/23/24 0500 01/24/24 0600  Weight: 68.9 kg 67.3 kg 73.2 kg    Examination: General: critically ill appearing male, intubated and sedated Neuro: pupils 5 mm and non-reactive bilaterally, no corneal reflexes, +cough, overbreathes ventilator, flaccid BUE, minimal withdrawal BLE HENT: [place]/AT, MMM, sclera anicteric,  Lungs: on mechanical ventilation, coarse bilaterally Cardiovascular: irregular, normal S1 and S2, no m/r/g Abdomen: soft, non-tender, non-distended Extremities: warm, dry, no edema GU: foley in place draining clear urine  Resolved problem list   Assessment and Plan   Bilateral hemispheric CVA with hemorrhagic transformation Cerebral edema Uncal herniation L MCA CVA Acute hypoxic resp failure  Aspiration PNA Dysphagia Tobacco use, possible underlying COPD  AKI Elevated trop, suspect demand ischemia HFmrEF  Afib, rate controlled   HTN HLD  Tobacco use THC use EtOH use MRI obtained overnight given worsening neuro status which demonstrated bilateral cerebral infarction, hemorrhagic conversion, extensive cerebral edema with complete effacement of the right lateral ventricle and 15 mm MLS, uncal herniation and restricted diffusion throughout the midbrain and pons. Per neurology likely non-survivable. Patient developed acute hypertension followed by hypotension this morning, levophed started. Likely progression of  herniation, family called and changed code status to DNR with plan to transition to comfort when they  arrived at bedside. Started on fentanyl infusion and PRN midaz. Plan to proceed with terminal extubation. See separate IPAL note.   Labs   CBC: Recent Labs  Lab 01/18/24 1922 01/18/24 1948 01/21/24 0113 01/23/24 0831 01/23/24 2052 01/24/24 0228  WBC 8.9  --  8.4 9.6  --  13.3*  HGB 16.2 15.6 16.7 17.5* 17.0 17.9*  HCT 47.7 46.0 47.6 50.5 50.0 53.2*  MCV 96.4  --  93.7 95.6  --  98.9  PLT 240  --  242 243  --  248    Basic Metabolic Panel: Recent Labs  Lab 01/21/24 0113 01/22/24 0120 01/23/24 0831 01/23/24 2052 01/24/24 0228 02/03/2024 0249  NA 136 139 138 140 139 146*  K 3.3* 4.0 4.3 4.5 5.0 4.1  CL 105 107 103  --  104 112*  CO2 20* 22 22  --  21* 22  GLUCOSE 152* 148* 153*  --  133* 149*  BUN 18 16 17   --  29* 38*  CREATININE 1.04 1.05 1.12  --  1.49* 1.65*  CALCIUM  8.6* 9.0 9.6  --  9.4 9.2  MG 2.2 2.3 2.4  --  2.5* 2.9*  PHOS 2.7 4.0 4.9*  --  5.8* 4.1   GFR: Estimated Creatinine Clearance: 46.7 mL/min (A) (by C-G formula based on SCr of 1.65 mg/dL (H)). Recent Labs  Lab 01/18/24 1922 01/21/24 0113 01/23/24 0831 01/23/24 2035 01/23/24 2300 01/24/24 0228  PROCALCITON  --   --   --   --  <0.10  --   WBC 8.9 8.4 9.6  --   --  13.3*  LATICACIDVEN  --   --   --  2.3* 1.9 1.8    Liver Function Tests: Recent Labs  Lab 01/19/24 0225 01/24/24 0228  AST 26 43*  ALT 21 37  ALKPHOS 63 58  BILITOT 3.3* 1.7*  PROT 6.6 7.5  ALBUMIN 3.5 3.5   No results for input(s): LIPASE, AMYLASE in the last 168 hours. No results for input(s): AMMONIA in the last 168 hours.  ABG    Component Value Date/Time   PHART 7.411 01/23/2024 2052   PCO2ART 40.5 01/23/2024 2052   PO2ART 384 (H) 01/23/2024 2052   HCO3 25.7 01/23/2024 2052   TCO2 27 01/23/2024 2052   O2SAT 100 01/23/2024 2052     Coagulation Profile: No results for input(s): INR, PROTIME in the last 168 hours.  Cardiac Enzymes: Recent Labs  Lab 01/18/24 1922  CKTOTAL 309    HbA1C: Hgb A1c  MFr Bld  Date/Time Value Ref Range Status  01/19/2024 02:25 AM 5.6 4.8 - 5.6 % Final    Comment:    (NOTE)         Prediabetes: 5.7 - 6.4         Diabetes: >6.4         Glycemic control for adults with diabetes: <7.0     CBG: Recent Labs  Lab 01/24/24 1610 01/24/24 1937 01/24/24 2324 01/30/2024 0351 01/26/2024 0746  GLUCAP 177* 184* 163* 139* 128*    CRITICAL CARE  The patient is critically ill with multiple organ system failure and requires high complexity decision making for assessment and support, frequent evaluation and titration of therapies, advanced monitoring, review of radiographic studies and interpretation of complex data.   Critical Care Time devoted to patient care services, exclusive of separately billable procedures, described in  this note is 30 minutes.  Rexene LOISE Blush, PA-C [facility] 02/06/2024 11:35 AM  Please see [email or web address] for pager details.  From 7A-7P if no response, please call [phone] After hours, please call ELink [phone]

## 2024-02-25 NOTE — Progress Notes (Signed)
eLink Physician-Brief Progress Note Patient Name: Edward Phelps DOB: 1960/07/05 MRN: [id number]   Date of Service  02/08/2024  HPI/Events of Note  Case discussed with neuro on call, Dr Voncile. Appreciate their input. Unfortunate devastating findings on MRI.   eICU Interventions  Will need palliative care in AM.      Intervention Category Major Interventions: Respiratory failure - evaluation and management  Demontray Franta G Aury Scollard 01/28/2024, 5:33 AM

## 2024-02-25 NOTE — Progress Notes (Signed)
 Nutrition Brief Note  Chart reviewed. Pt transitioning to comfort care today. No TF currently ordered.  No further nutrition interventions planned at this time.  Please re-consult as needed.   Betsey Finger MS, RDN, LDN, CNSC Registered Dietitian 3 Clinical Nutrition RD Inpatient Contact Info in Amion

## 2024-02-25 NOTE — Progress Notes (Signed)
OT Cancellation Note  Patient Details Name: Edward Phelps MRN: [id number] DOB: [birth year]   Cancelled Treatment:    Reason Eval/Treat Not Completed: Other (comment);Medical issues which prohibited therapy (Pt intubated, RASS -5, with plans for palliative discussions today. Will hold at this time and follow up as appropriate.)  Alka Falwell K, OTD, OTR/L [facility] [phone]   Laneta MARLA Pereyra 02/15/2024, 7:18 AM

## 2024-02-25 NOTE — Progress Notes (Addendum)
 Chaplain responded to unit page request for prayer before extubation. Chaplain met with family who explained they are not religious themselves, but that pt Edward Phelps is, and recently even more so. I provided Christian prayer as requested, and assured family that we remain available for further support regardless of religious status. Debriefed with RN afterwards.

## 2024-02-25 NOTE — IPAL (Signed)
  Interdisciplinary Goals of Care Family Meeting   Date carried out: 01/27/2024  Location of the meeting: Phone conference  Member's involved: Other: Physician Assistant  Durable Power of Attorney or acting medical decision maker: Patient's son, Jaheem Hedgepath    Discussion: We discussed goals of care for [employer]. I explained to son that the MRI brain overnight demonstrated right hemispheric infarction and new bleeding with extensive swelling causing the brain to herniate causing brainstem stroke. Given these findings we expect herniation to progress. The amount of brain injury is non-survivable. His blood pressure is dropping suggesting herniation is continuing and he is in the process of dying. Recommended DNR with transition to comfort care. Son agrees to change code status to DNR. He and family plan to arrive in about an hour at which time we will proceed with terminal extubation and comfort care. In the interim will support blood pressure with levophed.  Code status:   Code Status: Limited: Do not attempt resuscitation (DNR) -DNR-LIMITED -Do Not Intubate/DNI    Disposition: In-patient comfort care  Time spent for the meeting: 5 minutes    Rexene LOISE Blush, PA-C  02/09/2024, 8:55 AM

## 2024-02-25 NOTE — Progress Notes (Signed)
 Patient extubated to comfort per written order with RT and RN at bedside.

## 2024-02-25 DEATH — deceased

## 2024-03-07 MED FILL — Medication: Qty: 1 | Status: AC
